# Patient Record
Sex: Male | Born: 1970 | Race: White | Marital: Single | Smoking: Current every day smoker
Health system: Southern US, Community
[De-identification: ages and names within clinical notes are randomized; demographics above are authoritative.]

## PROBLEM LIST (undated history)

## (undated) DIAGNOSIS — E669 Obesity, unspecified: Secondary | ICD-10-CM

## (undated) DIAGNOSIS — Z720 Tobacco use: Secondary | ICD-10-CM

## (undated) DIAGNOSIS — L301 Dyshidrosis [pompholyx]: Secondary | ICD-10-CM

## (undated) DIAGNOSIS — M5430 Sciatica, unspecified side: Secondary | ICD-10-CM

## (undated) DIAGNOSIS — R809 Proteinuria, unspecified: Secondary | ICD-10-CM

## (undated) DIAGNOSIS — R748 Abnormal levels of other serum enzymes: Secondary | ICD-10-CM

## (undated) DIAGNOSIS — F419 Anxiety disorder, unspecified: Secondary | ICD-10-CM

## (undated) DIAGNOSIS — G4700 Insomnia, unspecified: Secondary | ICD-10-CM

## (undated) DIAGNOSIS — E785 Hyperlipidemia, unspecified: Secondary | ICD-10-CM

## (undated) DIAGNOSIS — D72829 Elevated white blood cell count, unspecified: Secondary | ICD-10-CM

## (undated) DIAGNOSIS — I10 Essential (primary) hypertension: Secondary | ICD-10-CM

## (undated) DIAGNOSIS — IMO0001 Reserved for inherently not codable concepts without codable children: Secondary | ICD-10-CM

## (undated) HISTORY — DX: Abnormal levels of other serum enzymes: R74.8

## (undated) HISTORY — DX: Proteinuria, unspecified: R80.9

## (undated) HISTORY — PX: BACK SURGERY: SHX140

## (undated) HISTORY — DX: Insomnia, unspecified: G47.00

## (undated) HISTORY — DX: Obesity, unspecified: E66.9

## (undated) HISTORY — DX: Elevated white blood cell count, unspecified: D72.829

## (undated) HISTORY — DX: Dyshidrosis (pompholyx): L30.1

## (undated) HISTORY — DX: Hyperlipidemia, unspecified: E78.5

## (undated) HISTORY — DX: Anxiety disorder, unspecified: F41.9

## (undated) HISTORY — DX: Essential (primary) hypertension: I10

## (undated) HISTORY — DX: Reserved for inherently not codable concepts without codable children: IMO0001

## (undated) HISTORY — DX: Sciatica, unspecified side: M54.30

## (undated) HISTORY — DX: Tobacco use: Z72.0

---

## 1988-08-21 ENCOUNTER — Emergency Department: Admit: 1988-08-21 | Payer: Self-pay | Source: Ambulatory Visit

## 1990-01-28 ENCOUNTER — Emergency Department: Admit: 1990-01-28 | Disposition: A | Discharge status: Home / Self Care | Payer: Self-pay | Source: Ambulatory Visit

## 1990-08-01 ENCOUNTER — Emergency Department: Admit: 1990-08-01 | Disposition: A | Discharge status: Home / Self Care | Payer: Self-pay | Source: Ambulatory Visit

## 1990-08-02 ENCOUNTER — Inpatient Hospital Stay
Admission: EM | Admit: 1990-08-02 | Disposition: A | Discharge status: Home / Self Care | Payer: Self-pay | Source: Ambulatory Visit

## 1992-01-28 ENCOUNTER — Emergency Department: Admit: 1992-01-28 | Disposition: A | Discharge status: Home / Self Care | Payer: Self-pay | Source: Ambulatory Visit

## 2003-05-27 ENCOUNTER — Emergency Department
Admission: RE | Admit: 2003-05-27 | Disposition: A | Discharge status: Home / Self Care | Payer: Self-pay | Source: Ambulatory Visit

## 2007-08-27 ENCOUNTER — Ambulatory Visit
Admission: RE | Admit: 2007-08-27 | Disposition: A | Discharge status: Home / Self Care | Payer: Self-pay | Source: Ambulatory Visit

## 2011-02-26 ENCOUNTER — Ambulatory Visit
Admission: RE | Admit: 2011-02-26 | Disposition: A | Discharge status: Home / Self Care | Payer: Self-pay | Source: Ambulatory Visit

## 2012-02-13 ENCOUNTER — Ambulatory Visit
Admission: RE | Admit: 2012-02-13 | Disposition: A | Discharge status: Home / Self Care | Payer: Self-pay | Source: Ambulatory Visit

## 2012-03-21 ENCOUNTER — Emergency Department
Admission: EM | Admit: 2012-03-21 | Disposition: A | Discharge status: Home / Self Care | Payer: Self-pay | Source: Ambulatory Visit

## 2012-10-22 ENCOUNTER — Other Ambulatory Visit (INDEPENDENT_AMBULATORY_CARE_PROVIDER_SITE_OTHER): Payer: Self-pay | Admitting: Family Medicine

## 2012-10-29 ENCOUNTER — Ambulatory Visit (INDEPENDENT_AMBULATORY_CARE_PROVIDER_SITE_OTHER)
Admission: RE | Admit: 2012-10-29 | Discharge: 2012-10-29 | Disposition: A | Discharge status: Home / Self Care | Payer: Commercial Managed Care - POS | Source: Ambulatory Visit | Attending: Family Medicine | Admitting: Family Medicine

## 2013-03-16 ENCOUNTER — Ambulatory Visit
Admission: RE | Admit: 2013-03-16 | Discharge: 2013-03-16 | Disposition: A | Discharge status: Home / Self Care | Payer: Commercial Managed Care - POS | Source: Ambulatory Visit | Attending: Family Medicine | Admitting: Family Medicine

## 2013-03-16 ENCOUNTER — Other Ambulatory Visit: Payer: Self-pay | Admitting: Family Medicine

## 2013-03-16 DIAGNOSIS — M25519 Pain in unspecified shoulder: Secondary | ICD-10-CM | POA: Insufficient documentation

## 2014-05-26 ENCOUNTER — Telehealth: Payer: Self-pay

## 2014-05-26 NOTE — Telephone Encounter (Signed)
Call to patient to reschedule appointment from 8:15 this am which he did not show for.Message left with phone number to call to reschedule appointment.

## 2014-12-09 ENCOUNTER — Telehealth: Payer: Self-pay

## 2014-12-09 NOTE — Telephone Encounter (Signed)
Phone call to schedule assessment to start diabetes education. Message left with callback information.

## 2015-01-17 ENCOUNTER — Ambulatory Visit: Payer: No Typology Code available for payment source | Attending: Family Medicine

## 2015-01-17 VITALS — BP 118/78 | Ht 69.0 in | Wt 221.4 lb

## 2015-01-17 DIAGNOSIS — Z713 Dietary counseling and surveillance: Secondary | ICD-10-CM | POA: Insufficient documentation

## 2015-01-17 DIAGNOSIS — Z6832 Body mass index (BMI) 32.0-32.9, adult: Secondary | ICD-10-CM | POA: Insufficient documentation

## 2015-01-17 DIAGNOSIS — Z794 Long term (current) use of insulin: Secondary | ICD-10-CM | POA: Insufficient documentation

## 2015-01-17 DIAGNOSIS — E1165 Type 2 diabetes mellitus with hyperglycemia: Secondary | ICD-10-CM | POA: Insufficient documentation

## 2015-01-17 DIAGNOSIS — IMO0001 Reserved for inherently not codable concepts without codable children: Secondary | ICD-10-CM

## 2015-01-17 DIAGNOSIS — Z7984 Long term (current) use of oral hypoglycemic drugs: Secondary | ICD-10-CM | POA: Insufficient documentation

## 2015-01-17 NOTE — Progress Notes (Signed)
S: Pt attended an assessment today. He has a strong family hx of DM and has been taking meds for DM for 6 years.  His job is stressful and interferes with ease of healthy diet and exercise. SMBG last in Dec of 2015 when he feared BG affect on vision.  Some intentional wt loss. Her was without one medication prior to last A1c test.    O: No BG data today; wt 221.4; 04/2014 A1c was 10.6.    A: Pt could lose 2# a week initially if he stops drinking regular root beer. He is contemplating that as well as a 10 min walk after work.  Pt is willing to take DM classes this week.    P: Limit root beer intake, increase water; walk after work; come to morning DM classes this week as scheduled.

## 2015-01-18 ENCOUNTER — Ambulatory Visit: Payer: No Typology Code available for payment source

## 2015-01-18 ENCOUNTER — Telehealth: Payer: Self-pay

## 2015-01-18 NOTE — Telephone Encounter (Signed)
Pt no show for class.  Left voice mail asking pt to call [phone] to r/s classes.

## 2015-01-19 ENCOUNTER — Ambulatory Visit: Payer: No Typology Code available for payment source

## 2015-01-20 ENCOUNTER — Ambulatory Visit: Payer: No Typology Code available for payment source

## 2015-01-21 ENCOUNTER — Encounter (INDEPENDENT_AMBULATORY_CARE_PROVIDER_SITE_OTHER): Payer: Self-pay

## 2015-02-03 ENCOUNTER — Encounter (INDEPENDENT_AMBULATORY_CARE_PROVIDER_SITE_OTHER): Payer: Self-pay

## 2015-02-03 DIAGNOSIS — G4700 Insomnia, unspecified: Secondary | ICD-10-CM | POA: Insufficient documentation

## 2015-02-03 DIAGNOSIS — F419 Anxiety disorder, unspecified: Secondary | ICD-10-CM | POA: Insufficient documentation

## 2015-02-03 DIAGNOSIS — R748 Abnormal levels of other serum enzymes: Secondary | ICD-10-CM | POA: Insufficient documentation

## 2015-02-03 DIAGNOSIS — M5430 Sciatica, unspecified side: Secondary | ICD-10-CM | POA: Insufficient documentation

## 2015-02-03 DIAGNOSIS — I10 Essential (primary) hypertension: Secondary | ICD-10-CM | POA: Insufficient documentation

## 2015-02-03 DIAGNOSIS — L301 Dyshidrosis [pompholyx]: Secondary | ICD-10-CM | POA: Insufficient documentation

## 2015-02-03 DIAGNOSIS — E785 Hyperlipidemia, unspecified: Secondary | ICD-10-CM | POA: Insufficient documentation

## 2015-02-03 DIAGNOSIS — E669 Obesity, unspecified: Secondary | ICD-10-CM | POA: Insufficient documentation

## 2015-02-03 DIAGNOSIS — R809 Proteinuria, unspecified: Secondary | ICD-10-CM | POA: Insufficient documentation

## 2015-02-03 DIAGNOSIS — Z794 Long term (current) use of insulin: Secondary | ICD-10-CM

## 2015-02-03 DIAGNOSIS — D72829 Elevated white blood cell count, unspecified: Secondary | ICD-10-CM | POA: Insufficient documentation

## 2015-02-03 DIAGNOSIS — F17210 Nicotine dependence, cigarettes, uncomplicated: Secondary | ICD-10-CM | POA: Insufficient documentation

## 2015-02-08 ENCOUNTER — Ambulatory Visit (INDEPENDENT_AMBULATORY_CARE_PROVIDER_SITE_OTHER): Payer: No Typology Code available for payment source

## 2015-02-17 ENCOUNTER — Ambulatory Visit (INDEPENDENT_AMBULATORY_CARE_PROVIDER_SITE_OTHER): Payer: No Typology Code available for payment source | Admitting: Internal Medicine

## 2015-02-17 ENCOUNTER — Encounter (INDEPENDENT_AMBULATORY_CARE_PROVIDER_SITE_OTHER): Payer: Self-pay

## 2015-02-17 VITALS — BP 100/72 | HR 80 | Ht 68.25 in | Wt 218.2 lb

## 2015-02-17 DIAGNOSIS — E782 Mixed hyperlipidemia: Secondary | ICD-10-CM

## 2015-02-17 DIAGNOSIS — E1165 Type 2 diabetes mellitus with hyperglycemia: Secondary | ICD-10-CM

## 2015-02-17 DIAGNOSIS — F17210 Nicotine dependence, cigarettes, uncomplicated: Secondary | ICD-10-CM

## 2015-02-17 DIAGNOSIS — I10 Essential (primary) hypertension: Secondary | ICD-10-CM

## 2015-02-17 DIAGNOSIS — Z6832 Body mass index (BMI) 32.0-32.9, adult: Secondary | ICD-10-CM

## 2015-02-17 MED ORDER — INSULIN DEGLUDEC 100 UNIT/ML SC SOPN
10.0000 [IU] | PEN_INJECTOR | Freq: Every day | SUBCUTANEOUS | Status: DC
Start: 2015-02-17 — End: 2015-07-20

## 2015-02-17 MED ORDER — INSULIN PEN NEEDLE 32G X 4 MM MISC
Status: DC
Start: 2015-02-17 — End: 2015-11-04

## 2015-02-17 MED ORDER — DULAGLUTIDE 0.75 MG/0.5ML SC SOPN
0.7500 mg | PEN_INJECTOR | SUBCUTANEOUS | Status: DC
Start: 2015-02-17 — End: 2015-06-27
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
## 2015-02-17 NOTE — Patient Instructions (Addendum)
Below is a summary of information and instructions we reviewed at today's appointment. Please review this information carefully and call if you have any questions regarding it.     You are due to have your next lab work after: 03/03/15  Please bring your meter to the lab and check your sugar while there.  You should fast for 10 hours prior to having the labwork done.  We have recommended that you check glucose  Once daily at varying times.  Please check that strips are not expired (either by date or opened longer than 3-6 months, depending on meter you have).  Please fax or send blood sugar readings for further adjustment of regimen in 2 weeks.  Insulin regimen: start Tresiba 10 units daily. Take this every am. If this is not covered, we will prescribe another once daily insulin.   Diet: Please decrease your intake of simple sugars and starches. Please watch your portions.  Please aim for a consistent carbohydrate intake at each meal.  Exercise: We have recommended that you begin an exercise regimen.   Foot care: Please avoid being barefoot or wearing of ill fitting shoes. Please examine feet daily and appropriately treat callouses or lesions. Please use lotion as needed to treat dry skin on feet.   Diabetic Education: Please contact diabetes education [phone]) to arrange 1:1 education if unable to attend group class.   Diabetic screening is up-to-date.    Cholesterol/lipids: Your cholesterol levels are elevated. We have reviewed that improvement in diabetic control and decreased intake of "simple" sugars (including fruit, fruit juice, other sweet drinks and sugary foods) can help to improve your triglycerides.    Blood Pressure: Your blood pressure is well controlled on the current medication regimen and we will continue this.

## 2015-03-29 ENCOUNTER — Telehealth (INDEPENDENT_AMBULATORY_CARE_PROVIDER_SITE_OTHER): Payer: Self-pay

## 2015-03-29 NOTE — Telephone Encounter (Signed)
Pt called to have labs faxed to PCP. CNR

## 2015-03-30 ENCOUNTER — Ambulatory Visit (INDEPENDENT_AMBULATORY_CARE_PROVIDER_SITE_OTHER): Payer: No Typology Code available for payment source | Admitting: Family

## 2015-04-10 ENCOUNTER — Telehealth (INDEPENDENT_AMBULATORY_CARE_PROVIDER_SITE_OTHER): Payer: Self-pay | Admitting: Internal Medicine

## 2015-04-10 NOTE — Telephone Encounter (Signed)
Please call pt, I see he pushed out his f/u appt with  JC and is overdue for labs. Please get labs ASAP and he needs to KEEP that f/u appt. We can't help him if he is not coming regularly!

## 2015-04-11 NOTE — Telephone Encounter (Signed)
Spoke with patient and he is having his labs done on Wed Dec 28 at [facility].  He has requested that I fax his labs to them.  Which I have done.  That office is closed today, so he will call them to make sure they have received them

## 2015-04-14 ENCOUNTER — Encounter (INDEPENDENT_AMBULATORY_CARE_PROVIDER_SITE_OTHER): Payer: Self-pay

## 2015-04-14 DIAGNOSIS — E1165 Type 2 diabetes mellitus with hyperglycemia: Secondary | ICD-10-CM

## 2015-04-14 DIAGNOSIS — E782 Mixed hyperlipidemia: Secondary | ICD-10-CM

## 2015-04-14 LAB — COMPREHENSIVE METABOLIC PANEL
ALT: 28 U/L (ref 10–40)
AST (SGOT): 17 U/L (ref 14–40)
Albumin/Globulin Ratio: 1.8
Albumin: 4.5
Alkaline Phosphatase Total: 95
BUN / Creatinine Ratio: 22.4
BUN: 13
Bilirubin, Total: 0.5 mg/dL (ref 0.1–1.4)
CO2: 26 mmol/L — AB (ref 13–22)
Calcium: 9.2 mg/dL (ref 8.7–10.7)
Chloride: 105
Creatinine: 0.58
Globulin: 2.5
Glucose: 156
Potassium: 4.4
Protein, Total: 7
Sodium: 141

## 2015-04-14 LAB — LIPID PANEL
Cholesterol / HDL Ratio: 4.1
Cholesterol: 180
HDL: 44 mg/dL (ref 35–70)
LDL: 111
NON HDL CHOLESTEROL: 136
Triglycerides: 126

## 2015-04-14 LAB — HEMOGLOBIN A1C: A1c: 9.6

## 2015-04-14 LAB — VITAMIN D,25 OH,TOTAL: Vitamin D, 25 OH, Total: 32

## 2015-04-14 NOTE — Progress Notes (Signed)
Labs updated. CNR

## 2015-04-27 ENCOUNTER — Encounter (INDEPENDENT_AMBULATORY_CARE_PROVIDER_SITE_OTHER): Payer: Self-pay | Admitting: Family

## 2015-04-27 ENCOUNTER — Ambulatory Visit (INDEPENDENT_AMBULATORY_CARE_PROVIDER_SITE_OTHER): Payer: No Typology Code available for payment source | Admitting: Family

## 2015-04-27 VITALS — BP 122/70 | HR 72 | Resp 16 | Ht 68.25 in | Wt 217.0 lb

## 2015-04-27 DIAGNOSIS — E782 Mixed hyperlipidemia: Secondary | ICD-10-CM

## 2015-04-27 DIAGNOSIS — I10 Essential (primary) hypertension: Secondary | ICD-10-CM

## 2015-04-27 DIAGNOSIS — Z6832 Body mass index (BMI) 32.0-32.9, adult: Secondary | ICD-10-CM

## 2015-04-27 DIAGNOSIS — F17210 Nicotine dependence, cigarettes, uncomplicated: Secondary | ICD-10-CM

## 2015-04-27 DIAGNOSIS — E1165 Type 2 diabetes mellitus with hyperglycemia: Secondary | ICD-10-CM

## 2015-04-27 NOTE — Patient Instructions (Signed)
Below is a summary of information and instructions we reviewed at today's appointment. Please review this information carefully and call if you have any questions regarding it.     Diabetes:   Labwork: You are due for labwork on or after 07/13/15  You should fast for 10 hours prior to having this labwork done.  Please take your meter to the lab and check your fingerstick blood sugar while there. Do not ask lab staff to check your meter, they do not know how! Please call or send [place] a message when you get home with the reading you got so we can compare to what the lab got and verify your meter is functioning satisfactorily.   Self-monitoring of blood glucose:  We recommend that you monitor your blood glucose at least once daily at varying times  Insulin regimen: continue as prescribed for now. We will plan to start Trulicity on Sunday.  Please obtain and wear a medical alert bracelet or necklace indicating you have diabetes.   Diet: Please watch your portions.  Please try to avoid missing meals.  Exercise: We have recommended that you begin an exercise regimen.   Foot care: Please avoid being barefoot or wearing of ill fitting shoes. Please examine feet daily and appropriately treat callouses or lesions. Please use lotion as needed to treat dry skin on feet.   Diabetic Education: I have recommended that you meet with the nurse educator and dietition to review  Comprehensive Diabetes Education Program Please call and schedule appointment.  The following additional instructions relate to health conditions other than your diabetes:   Please continue follow up with mental health provider.

## 2015-04-27 NOTE — Progress Notes (Addendum)
Provider Progress Note      Patient Name:  Wyatt Campbell [[id number]] DOB: 06-26-1970  Date: 04/27/2015    Subjective:          Wyatt Campbell is a 45 y.o. male who presents for follow up of type 2 diabetes mellitus and instruction regarding Trulicity pen use.   Since the last visit,  the patient has been well with no interval health problems.  Mood has been "fine."    Monitoring, Compliance and Complications:  Diagnosis Date: 2009  HGB A1C goal based on age and comorbidities is 7%.  The patient has no diabetic complications.   Victoza (stopped due to insurance), Byetta (cost)    Glucose Meter: unknown N  Glucose Meter Validation Date: never   The patient did not bring the glucose meter or glucose readings today.  Compliance with blood glucose monitoring: -   The patient is currently testing home blood glucose  - times per day    Glucose readings are as follows:  -    Injections:   Injections are given by patient.   Injections sites include: abdominal wall  Injection compliance: The patient sometimes misses injections  partly due to cost..  No problems noted with injection sites.     Diet, Exercise, and Weight:     The patient follows no particular diet.  Compliance with diet has been "getting better"  Exercise: The patient does not exercise.  Weight: Weight has been stable since last visit.    Hypoglycemia:   There is no history of hypoglycemia.  The patient does not have a Glucagon Emergency Kit.  The patient does not have a medical alert bracelet or necklace.    Diabetic Education: The patient has received diabetes education, most recently October 2016. when patient attended assessment.        Diabetic complication Screening  Eye exam: Aprl 2016 ?[facility] no BDR reported  Urine microalbumin/creatinine ratio: 12/01/14, 2 mg/dl, negative  Foot Care: The patient reports no new foot problems. The patient checks the feet regularly.  The patient cuts toenails without  difficulty.   Monofilament: 02/17/15, intact  Aspirin Therapy: The patient is not currently taking aspirin due to not starting it. It has been recommended in past. no hx GIB/allergy  Cardiovascular risk factors: diabetes mellitus, dyslipidemia, hypertension, male gender, obesity (BMI >= 30 kg/m2) and smoking/ tobacco exposure  Ace Inhibitor/ARB: The patient is taking ACE-I/ARB at dose indicated in medication list and is tolerating this well.  Dental care: Patient has received regular dental care, most recently 02/16/15    Current Outpatient Prescriptions   Medication Sig Dispense Refill   . atorvastatin (LIPITOR) 40 MG tablet Take 40 mg by mouth daily.     . Empagliflozin (JARDIANCE) 25 MG Tab Take by mouth.     Marland Kitchen FA-B6-B12-Omega 3-Phytosterols (BP VIT 3 PO) Take by mouth nightly.     Marland Kitchen FLUoxetine (PROZAC) 10 MG capsule Take 10 mg by mouth daily.     . Insulin Degludec (TRESIBA FLEXTOUCH) 100 UNIT/ML Solution Pen-injector Inject 10 Units into the skin daily. 5 pen 1   . Insulin Pen Needle (BD PEN NEEDLE NANO U/F) 32G X 4 MM Misc Use to inject SC daily 100 each 1   . Lancets (ACCU-CHEK MULTICLIX) lancets Test BID     . lisinopril (PRINIVIL,ZESTRIL) 20 MG tablet Take 20 mg by mouth daily.     . Melatonin 10 MG Cap Take by mouth.     Marland Kitchen  metFORMIN (GLUCOPHAGE) 500 MG tablet TAKE 1 TABLET BY MOUTH EVERY DAY  5   . Multiple Vitamin (MULTIVITAMIN) capsule Take 1 capsule by mouth daily.     . Omega-3 Fatty Acids (OMEGA-3 FISH OIL) 500 MG Cap Take by mouth.     . Dulaglutide (TRULICITY) 0.75 MG/0.5ML Solution Pen-injector Inject 0.75 mg into the skin once a week. 4 pen 0     No current facility-administered medications for this visit.      Medication review with Juanda Chance suggested noncompliance some of the time related to finances and issues with prescription coverage for diabetes medication(s).       Immunization History   Administered Date(s) Administered   . Influenza quadrivalent (IM) PF 3 Yrs & greater  01/29/2015     The following portions of the patient's history were reviewed and updated as appropriate: allergies, current medications, past family history, past medical history, past social history, past surgical history and problem list.     Review of Systems  As above     Objective:      Vital Signs: BP 122/70 mmHg  Pulse 72  Resp 16  Ht 1.734 m (5' 8.25")  Wt 98.431 kg (217 lb)  BMI 32.74 kg/m2  Body mass index is 32.74 kg/(m^2).  PE  Well nourished, well appearing, in no acute distress.  HEENT:  There is no stare or lid lag. There is no periorbital edema. Extraoccular movements are intact. No conjunctival injection.  Mucous membranes are moist. Good dentition.  Neck is supple without adenopathy.  The thyroid is normal in size and consistency and no nodules are appreciated.  Lungs are clear to auscultation.  Cardiac exam reveals a regular rate and rhythm. No murmur or gallop is appreciated.   Exam of the extremities reveals no peripheral edema.      There is no significant deformity of the feet.  Callouses are noted great toe on right, mild. Toenail hygeine is  fair   On neurologic exam, the patient is alert and appropriate. Gait and speech are normal.   Skin is cool and dry.      Lab Review  Labs obtained from outside facility [facility]  were reviewed today, including:   04/14/15 glucose 156, creatinine 0.5, ALT 28, A1c 9.6%, vitamin D 32, LDL 111  12/01/14 A1c 11.5%, Tchol 268, trig 163, HDL 44, LDL 191, glucose 209, creat 0.56, TSH 0.818.     Lab Results   Component Value Date    CHOL 180 04/13/2015    HDL 44 04/13/2015    TRIG 161 04/13/2015    ALT 28 04/14/2015    VITD 32 04/13/2015        Assessment:      1. Uncontrolled type 2 diabetes mellitus with hyperglycemia, without long-term current use of insulin  Hemoglobin A1C    Lipid panel    ALT    Basic Metabolic Panel   2. BMI 32.0-32.9,adult     3. Mixed hyperlipidemia     4. Essential hypertension     5. Cigarette nicotine dependence without  complication         Diabetes: Diabetes not controlled but improving. Patient to start Trulicity. Demonstrated Trulicity pen use. Reviewed plan for review glucose readings and adjust regimen. Lifestyle modifications encouraged. Please see patient instructions regarding diabetic management outlined below.   Lipids: LDL above goal but improving based on most recent lab testing. Medication compliance has been a factor. Will continue to monitor  BP: Blood pressure is well controlled on the current regimen which is well tolerated and will be continued.    I again encouraged the patient to stop using tobacco products.     Plan:      Patient Instructions   Below is a summary of information and instructions we reviewed at today's appointment. Please review this information carefully and call if you have any questions regarding it.     Diabetes:   Labwork: You are due for labwork on or after 07/13/15  You should fast for 10 hours prior to having this labwork done.  Please take your meter to the lab and check your fingerstick blood sugar while there. Do not ask lab staff to check your meter, they do not know how! Please call or send [place] a message when you get home with the reading you got so we can compare to what the lab got and verify your meter is functioning satisfactorily.   Self-monitoring of blood glucose:  We recommend that you monitor your blood glucose at least once daily at varying times  Insulin regimen: continue as prescribed for now. We will plan to start Trulicity on Sunday.  Please obtain and wear a medical alert bracelet or necklace indicating you have diabetes.   Diet: Please watch your portions.  Please try to avoid missing meals.  Exercise: We have recommended that you begin an exercise regimen.   Foot care: Please avoid being barefoot or wearing of ill fitting shoes. Please examine feet daily and appropriately treat callouses or lesions. Please use lotion as needed to treat dry skin on feet.   Diabetic  Education: I have recommended that you meet with the nurse educator and dietition to review  Comprehensive Diabetes Education Program Please call and schedule appointment.  The following additional instructions relate to health conditions other than your diabetes:   Please continue follow up with mental health provider.            Return in about 2 months (around 06/25/2015).    Allene Pyo, NP

## 2015-05-08 ENCOUNTER — Other Ambulatory Visit (INDEPENDENT_AMBULATORY_CARE_PROVIDER_SITE_OTHER): Payer: Self-pay | Admitting: Internal Medicine

## 2015-05-08 DIAGNOSIS — E1165 Type 2 diabetes mellitus with hyperglycemia: Secondary | ICD-10-CM

## 2015-05-15 NOTE — Progress Notes (Signed)
Note reviewed. Agree with assessment and plan as outlined by Ms. Costin, CFNP above.  Jessey Stehlin A. Roschelle Calandra, MD, FACP, FACE

## 2015-05-18 ENCOUNTER — Ambulatory Visit: Payer: No Typology Code available for payment source | Attending: Internal Medicine

## 2015-05-18 DIAGNOSIS — IMO0001 Reserved for inherently not codable concepts without codable children: Secondary | ICD-10-CM

## 2015-05-18 DIAGNOSIS — E1165 Type 2 diabetes mellitus with hyperglycemia: Secondary | ICD-10-CM | POA: Insufficient documentation

## 2015-05-18 NOTE — Progress Notes (Signed)
Patient in for one on one education today with nurse and dietitian. Verbalized concern regarding cost because he has a bill from the appointment in October 2016. Patient assumed that cost for education would be like going to doctors office and he would only have a small copay. Wanted to know what he was going to be responsible for today. Informed patient that he would need to contact his insurance company for this information. States he will do that and then he will call to reschedule appointment.      No bill for today- patient not seen for education.

## 2015-06-20 ENCOUNTER — Other Ambulatory Visit (INDEPENDENT_AMBULATORY_CARE_PROVIDER_SITE_OTHER): Payer: Self-pay

## 2015-06-20 NOTE — Telephone Encounter (Signed)
Please contact patient re: Trulicity: Would like to consider dose increase. Is he tolerating med? Any episodes of hypoglycemia?Please send update on readings so we can determine if dose adjustment appropriate.

## 2015-06-20 NOTE — Telephone Encounter (Signed)
Pt tolerating well. CNR

## 2015-06-21 ENCOUNTER — Other Ambulatory Visit (INDEPENDENT_AMBULATORY_CARE_PROVIDER_SITE_OTHER): Payer: Self-pay

## 2015-06-21 NOTE — Telephone Encounter (Signed)
Pt will send in blood sugar readings tomorrow. CNR

## 2015-06-21 NOTE — Telephone Encounter (Signed)
Sent request for new dose

## 2015-06-21 NOTE — Telephone Encounter (Signed)
Pt tolerating medication no problems and no problems with low bs. He has however been out for 2 weeks. He is having difficulty getting from mail order where it needs to go. CNR

## 2015-06-21 NOTE — Telephone Encounter (Signed)
Can you please ask patient to send readings so we can review and determine if needs increase in dose of medication? Did he have some readings while on Trulicity?

## 2015-06-27 ENCOUNTER — Other Ambulatory Visit (INDEPENDENT_AMBULATORY_CARE_PROVIDER_SITE_OTHER): Payer: Self-pay | Admitting: Family

## 2015-06-27 MED ORDER — DULAGLUTIDE 0.75 MG/0.5ML SC SOPN
0.7500 mg | PEN_INJECTOR | SUBCUTANEOUS | Status: DC
Start: 2015-06-27 — End: 2015-07-20

## 2015-06-27 NOTE — Telephone Encounter (Signed)
Did not see glucose log. WIll send for same dose he had been on. Please ask patient to send readings for review in 2-3 weeks once back on Trulicity.

## 2015-06-29 NOTE — Telephone Encounter (Signed)
Pt aware and understanding. CNR

## 2015-07-18 ENCOUNTER — Telehealth (INDEPENDENT_AMBULATORY_CARE_PROVIDER_SITE_OTHER): Payer: Self-pay

## 2015-07-18 NOTE — Telephone Encounter (Signed)
Pt agrees to complete labs prior to appt.

## 2015-07-19 ENCOUNTER — Other Ambulatory Visit
Admission: RE | Admit: 2015-07-19 | Discharge: 2015-07-19 | Disposition: A | Discharge status: Home / Self Care | Payer: No Typology Code available for payment source | Source: Ambulatory Visit | Attending: Family | Admitting: Family

## 2015-07-19 DIAGNOSIS — E1165 Type 2 diabetes mellitus with hyperglycemia: Secondary | ICD-10-CM

## 2015-07-19 LAB — BASIC METABOLIC PANEL
Anion Gap: 17.8 mMol/L (ref 7.0–18.0)
BUN / Creatinine Ratio: 19.7 Ratio (ref 10.0–30.0)
BUN: 15 mg/dL (ref 7–22)
CO2: 24.9 mMol/L (ref 20.0–30.0)
Calcium: 9.6 mg/dL (ref 8.5–10.5)
Chloride: 100 mMol/L (ref 98–110)
Creatinine: 0.76 mg/dL — ABNORMAL LOW (ref 0.80–1.30)
EGFR: 109 mL/min/{1.73_m2} (ref 60–150)
Glucose: 159 mg/dL — ABNORMAL HIGH (ref 70–99)
Osmolality Calc: 280 mOsm/kg (ref 275–300)
Potassium: 4.7 mMol/L (ref 3.5–5.3)
Sodium: 138 mMol/L (ref 136–147)

## 2015-07-19 LAB — LIPID PANEL
Cholesterol: 200 mg/dL — ABNORMAL HIGH (ref 75–199)
Coronary Heart Disease Risk: 4.76
HDL: 42 mg/dL (ref 40–55)
LDL Calculated: 129 mg/dL
Triglycerides: 144 mg/dL (ref 10–150)
VLDL: 29 (ref 0–40)

## 2015-07-19 LAB — ALT: ALT: 28 U/L (ref 0–55)

## 2015-07-19 LAB — HEMOGLOBIN A1C: Hgb A1C, %: 10.7 %

## 2015-07-19 NOTE — Progress Notes (Signed)
Quick Note:    Labs received and reviewed and will address at upcoming appointment.     ______

## 2015-07-20 ENCOUNTER — Ambulatory Visit (INDEPENDENT_AMBULATORY_CARE_PROVIDER_SITE_OTHER): Payer: No Typology Code available for payment source | Admitting: Family

## 2015-07-20 ENCOUNTER — Other Ambulatory Visit (INDEPENDENT_AMBULATORY_CARE_PROVIDER_SITE_OTHER): Payer: Self-pay

## 2015-07-20 ENCOUNTER — Encounter (INDEPENDENT_AMBULATORY_CARE_PROVIDER_SITE_OTHER): Payer: Self-pay | Admitting: Family

## 2015-07-20 VITALS — BP 132/84 | HR 88 | Resp 16 | Ht 68.25 in | Wt 217.0 lb

## 2015-07-20 DIAGNOSIS — E782 Mixed hyperlipidemia: Secondary | ICD-10-CM

## 2015-07-20 DIAGNOSIS — Z794 Long term (current) use of insulin: Secondary | ICD-10-CM

## 2015-07-20 DIAGNOSIS — Z6832 Body mass index (BMI) 32.0-32.9, adult: Secondary | ICD-10-CM

## 2015-07-20 DIAGNOSIS — E1165 Type 2 diabetes mellitus with hyperglycemia: Secondary | ICD-10-CM

## 2015-07-20 DIAGNOSIS — F17210 Nicotine dependence, cigarettes, uncomplicated: Secondary | ICD-10-CM

## 2015-07-20 DIAGNOSIS — I10 Essential (primary) hypertension: Secondary | ICD-10-CM

## 2015-07-20 MED ORDER — INSULIN GLARGINE 100 UNIT/ML SC SOPN
10.0000 [IU] | PEN_INJECTOR | Freq: Every morning | SUBCUTANEOUS | Status: DC
Start: 2015-07-20 — End: 2015-07-21

## 2015-07-20 MED ORDER — DULAGLUTIDE 0.75 MG/0.5ML SC SOPN
0.7500 mg | PEN_INJECTOR | SUBCUTANEOUS | Status: AC
Start: 2015-07-20 — End: ?

## 2015-07-20 NOTE — Telephone Encounter (Signed)
Patient called to let you know that his insurance will not pay for basaglar, they will cover Lantus or toujeo, he wants one of these in the place of the basaglar, please

## 2015-07-20 NOTE — Patient Instructions (Signed)
Below is a summary of information and instructions we reviewed at today's appointment. Please review this information carefully and call if you have any questions regarding it.     Diabetes:   Labwork: You are due for labwork on or after 10/18/15  You should fast for 10 hours prior to having this labwork done.  Please take your meter to the lab and check your fingerstick blood sugar while there. Do not ask lab staff to check your meter, they do not know how! Please call or send [place] a message when you get home with the reading you got so we can compare to what the lab got and verify your meter is functioning satisfactorily.   Self-monitoring of blood glucose: We recommend that you monitor your blood glucose 2 times daily fasting and one other varying time such as before meal or bedtime Please fax, email, drop off or MyChartMessage your blood sugar readings for further adjustment of regimen  in one week  Diabetic medication regimen: Continue your current diabetic medication regimen unchanged except restart Trulicity, add one tablet metformin at dinner, switch from [place] to [place] same dose every 24 hours. Please let me know if problems tolerating regimen..  Hypoglycemia prevention and treatment:  Please carry fast-acting carbohydrate source (preferably glucose tablets) with you at all times. Please treat low blood sugar with 15 grams carbohydrate (4 glucose tablets), wait 15 minutes, and recheck sugar. Re-treat if necessary.   Diet: Please decrease your intake of simple sugars and starches. Please watch your portions.  Please try to avoid missing meals.  Exercise: We have recommended that you increase your exercise   Foot care:  Please avoid being barefoot or wearing of ill fitting shoes. Please examine feet daily and appropriately treat callouses or lesions. Please use lotion as needed to treat dry skin on feet.   Diabetic Education:  I have recommended that you meet with the nurse educator and dietition to review   Annual update on diabetic management    Diabetic screening is up-to-date except for an eye exam. It is important to have your eyes examined every year.

## 2015-07-20 NOTE — Progress Notes (Signed)
Provider Progress Note      Patient Name:  Wyatt Campbell [[id number]] DOB: 23-Jun-1970  Date: 07/20/2015    Subjective:          Wyatt Campbell Wyatt Campbell is a 45 y.o. male who presents for follow up of type 2 diabetes mellitus.   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  Mood has been slightly depressed. He is taking Prozac.  Patient reports that he has not taken Trulicity since January when he ran out of medication and was told insurance did not cover. Patient was advised to send glucose readings for review at that time. Patient notes that he has not been regularly self monitoring glucose. Evaristo Bury not covered by insurance.    Monitoring, Compliance and Complications:  Diagnosis Date: 2009  HGB A1C goal based on age and comorbidities is 7%.  The patient has no diabetic complications.   Victoza (stopped due to insurance), Byetta (cost), higher dose metformin (upset stomach)    Glucose Meter: unknown   Glucose Meter Validation Date: never   The patient did not bring the glucose meter or glucose readings today.  Compliance with blood glucose monitoring: inadequate   The patient is currently testing home blood glucose  maybe 1 times per week   He notes that time is a barrier.  Glucose readings are as follows:  March 31 glucose was 255    Injections:   Injections are given by patient.   Injections sites include: abdominal wall  Injection compliance: The patient never misses injections. Evaristo Bury)  No problems noted with injection sites.     Diet, Exercise, and Weight:     The patient follows no particular diet.  Compliance with diet has been variable.   Exercise: The patient does not exercise. Can be active at times at work.  Weight: Weight has been stable since last visit.    Hypoglycemia:   There is no history of hypoglycemia.  The patient does not have a Glucagon Emergency Kit.  The patient does not have a medical alert  bracelet or necklace.    Diabetic Education: The patient has received diabetes education, most recently October 2016. when patient attended assessment. He did present to DMP for education in February but did not attend due to billing issues. He is not interested in attending class again at [facility].        Diabetic complication Screening  Eye exam: Aprl 2016 ?[facility] no BDR reported He has received notification that he is due for follow up.  Urine microalbumin/creatinine ratio: 12/01/14, 2 mg/dl, negative  Foot Care: The patient reports no new foot problems. The patient checks the feet regularly.  The patient cuts toenails without difficulty.   Monofilament: 02/17/15, intact  Aspirin Therapy: The patient is not currently taking aspirin due to taking OTC anti-inflammatories at times for shoulder pain. no hx GIB/allergy  Cardiovascular risk factors: diabetes mellitus, dyslipidemia, hypertension, male gender, obesity (BMI >= 30 kg/m2) and smoking/ tobacco exposure   Statin therapy: The patient is taking statin therapy as indicated in the medication list below.  Ace Inhibitor/ARB: The patient is taking ACE-I/ARB at dose indicated in medication list and is tolerating this well.  Dental care: Patient has received regular dental care, most recently 02/16/15    Current Outpatient Prescriptions   Medication Sig Dispense Refill   . atorvastatin (LIPITOR) 40 MG tablet Take 40 mg by mouth daily.     Marland Kitchen  Empagliflozin (JARDIANCE) 25 MG Tab Take by mouth.     Marland Kitchen FA-B6-B12-Omega 3-Phytosterols (BP VIT 3 PO) Take by mouth nightly.     Marland Kitchen FLUoxetine (PROZAC) 20 MG capsule TAKE ONE CAPSULE BY MOUTH EVERY MORNING  8   . Insulin Degludec (TRESIBA FLEXTOUCH) 100 UNIT/ML Solution Pen-injector Inject 10 Units into the skin daily. 5 pen 1   . Insulin Pen Needle (BD PEN NEEDLE NANO U/F) 32G X 4 MM Misc Use to inject SC daily 100 each 1   . Lancets (ACCU-CHEK MULTICLIX) lancets Test BID     . lisinopril (PRINIVIL,ZESTRIL) 20 MG tablet  Take 20 mg by mouth daily.     . Melatonin 10 MG Cap Take by mouth.     . metFORMIN (GLUCOPHAGE) 500 MG tablet TAKE 1 TABLET BY MOUTH EVERY DAY  5   . Multiple Vitamin (MULTIVITAMIN) capsule Take 1 capsule by mouth daily.     . Omega-3 Fatty Acids (OMEGA-3 FISH OIL) 500 MG Cap Take by mouth.       No current facility-administered medications for this visit.      Medication review with Juanda Chance suggested noncompliance some of the time related to finances and issues with prescription coverage for diabetes medication(s).       Immunization History   Administered Date(s) Administered   . Influenza quadrivalent (IM) PF 3 Yrs & greater 01/29/2015   Pneumonia vaccine: recalls receiving this in past through PCP office  The following portions of the patient's history were reviewed and updated as appropriate: allergies, current medications, past family history, past medical history, past social history, past surgical history and problem list.     Review of Systems  As above     Objective:      Vital Signs: BP 132/84 mmHg  Pulse 88  Resp 16  Ht 1.734 m (5' 8.25")  Wt 98.431 kg (217 lb)  BMI 32.74 kg/m2  Body mass index is 32.74 kg/(m^2).  PE  Well nourished, well appearing, in no acute distress.  HEENT:  There is no stare or lid lag. There is no periorbital edema. Extraoccular movements are intact. No conjunctival injection.  Mucous membranes are moist. Good dentition.  Neck is supple without adenopathy.  The thyroid is normal in size and consistency and no nodules are appreciated.  Lungs are clear to auscultation.  Cardiac exam reveals a regular rate and rhythm. No murmur or gallop is appreciated.   Exam of the extremities reveals no peripheral edema.      There is no significant deformity of the feet. No callouses or other lesions are noted. Toenail hygeine is good.   On neurologic exam, the patient is alert and appropriate. Gait and speech are normal.   Skin is cool and dry.      Lab Review  Labs  obtained from outside facility [facility]  were reviewed today, including:   12/01/14 A1c 11.5%, Tchol 268, trig 163, HDL 44, LDL 191, glucose 209, creat 0.56, TSH 0.818.       Lab Results   Component Value Date    HGBA1CPERCNT 10.7 07/19/2015    CHOL 200* 07/19/2015    HDL 42 07/19/2015    LDL 129 07/19/2015    TRIG 144 07/19/2015    ALT 28 07/19/2015    VITD 32 04/13/2015    GLU 159* 07/19/2015    K 4.7 07/19/2015    CA 9.6 07/19/2015    CREAT 0.76* 07/19/2015    CO2 24.9  07/19/2015    EGFR 109 07/19/2015    NA 138 07/19/2015         Assessment:      1. Uncontrolled type 2 diabetes mellitus with hyperglycemia, with long-term current use of insulin     2. Uncontrolled type 2 diabetes mellitus with hyperglycemia, without long-term current use of insulin  Dulaglutide (TRULICITY) 0.75 MG/0.5ML Solution Pen-injector    Hemoglobin A1C    Lipid panel    ALT    Basic Metabolic Panel    DISCONTINUED: insulin glargine (BASAGLAR KWIKPEN) 100 UNIT/ML injection pen   3. BMI 32.0-32.9,adult     4. Mixed hyperlipidemia     5. Essential hypertension     6. Cigarette nicotine dependence without complication         Diabetes: Diabetes is suboptimally controlled. Discussed specific areas of diabetes management as detailed under patient instructions below. Encouraged patient to perform home glucose monitoring as per patient instructions to help improve overall control.  Patient agreeable to attend DM education through [facility]. Discussed recent update re: Trulicity being covered by his insurance starting this month. Will resend Rx and adjust metformin. See plan noted below.  Lipids: LDL above goal but improving based on most recent lab testing. Will continue current regimen and to check lipids with upcoming DM labs.  BP: Blood pressure is well controlled on the current regimen which is well tolerated and will be continued.    I again encouraged the patient to stop using tobacco products.     Plan:      Patient Instructions   Below is a  summary of information and instructions we reviewed at today's appointment. Please review this information carefully and call if you have any questions regarding it.     Diabetes:   Labwork: You are due for labwork on or after 10/18/15  You should fast for 10 hours prior to having this labwork done.  Please take your meter to the lab and check your fingerstick blood sugar while there. Do not ask lab staff to check your meter, they do not know how! Please call or send [place] a message when you get home with the reading you got so we can compare to what the lab got and verify your meter is functioning satisfactorily.   Self-monitoring of blood glucose: We recommend that you monitor your blood glucose 2 times daily fasting and one other varying time such as before meal or bedtime Please fax, email, drop off or MyChartMessage your blood sugar readings for further adjustment of regimen  in one week  Diabetic medication regimen: Continue your current diabetic medication regimen unchanged except restart Trulicity, add one tablet metformin at dinner, switch from [place] to [place] same dose every 24 hours. Please let me know if problems tolerating regimen..  Hypoglycemia prevention and treatment:  Please carry fast-acting carbohydrate source (preferably glucose tablets) with you at all times. Please treat low blood sugar with 15 grams carbohydrate (4 glucose tablets), wait 15 minutes, and recheck sugar. Re-treat if necessary.   Diet: Please decrease your intake of simple sugars and starches. Please watch your portions.  Please try to avoid missing meals.  Exercise: We have recommended that you increase your exercise   Foot care:  Please avoid being barefoot or wearing of ill fitting shoes. Please examine feet daily and appropriately treat callouses or lesions. Please use lotion as needed to treat dry skin on feet.   Diabetic Education:  I have recommended that you meet with the  nurse educator and dietition to review  Annual  update on diabetic management    Diabetic screening is up-to-date except for an eye exam. It is important to have your eyes examined every year.              Return in about 4 weeks (around 08/17/2015).    Allene Pyo, NP

## 2015-07-21 MED ORDER — INSULIN GLARGINE 100 UNIT/ML SC SOPN
PEN_INJECTOR | SUBCUTANEOUS | Status: DC
Start: 2015-07-21 — End: 2015-08-18

## 2015-07-21 MED ORDER — EMPAGLIFLOZIN 25 MG PO TABS
25.0000 mg | ORAL_TABLET | Freq: Every day | ORAL | Status: DC
Start: 2015-07-21 — End: 2015-11-04

## 2015-07-21 MED ORDER — METFORMIN HCL 500 MG PO TABS
ORAL_TABLET | ORAL | Status: DC
Start: 2015-07-21 — End: 2015-11-04

## 2015-07-21 NOTE — Telephone Encounter (Signed)
He wants a vial?

## 2015-07-21 NOTE — Telephone Encounter (Signed)
Never[place]nd

## 2015-07-21 NOTE — Telephone Encounter (Signed)
Please put in request for Lantus same dose

## 2015-07-24 NOTE — Progress Notes (Signed)
Note reviewed. Agree with assessment and plan as outlined by Ms. Costin, CFNP above.  Naiya Corral A. Erica Osuna, MD, FACP, FACE

## 2015-07-27 ENCOUNTER — Other Ambulatory Visit (INDEPENDENT_AMBULATORY_CARE_PROVIDER_SITE_OTHER): Payer: Self-pay

## 2015-07-27 NOTE — Telephone Encounter (Signed)
Patient called asking for test strips and lancets for new device.

## 2015-07-29 MED ORDER — GLUCOSE BLOOD VI STRP
ORAL_STRIP | Status: DC
Start: 2015-07-29 — End: 2015-11-04

## 2015-07-29 MED ORDER — ONETOUCH DELICA LANCETS 33G MISC
Status: DC
Start: 2015-07-29 — End: 2015-11-11

## 2015-08-18 ENCOUNTER — Ambulatory Visit (INDEPENDENT_AMBULATORY_CARE_PROVIDER_SITE_OTHER): Payer: No Typology Code available for payment source | Admitting: Family

## 2015-08-18 ENCOUNTER — Encounter (INDEPENDENT_AMBULATORY_CARE_PROVIDER_SITE_OTHER): Payer: Self-pay | Admitting: Family

## 2015-08-18 VITALS — BP 120/78 | HR 72 | Resp 16 | Ht 68.25 in | Wt 223.0 lb

## 2015-08-18 DIAGNOSIS — Z794 Long term (current) use of insulin: Secondary | ICD-10-CM

## 2015-08-18 DIAGNOSIS — I10 Essential (primary) hypertension: Secondary | ICD-10-CM

## 2015-08-18 DIAGNOSIS — E669 Obesity, unspecified: Secondary | ICD-10-CM

## 2015-08-18 DIAGNOSIS — E782 Mixed hyperlipidemia: Secondary | ICD-10-CM

## 2015-08-18 DIAGNOSIS — F17210 Nicotine dependence, cigarettes, uncomplicated: Secondary | ICD-10-CM

## 2015-08-18 DIAGNOSIS — E1165 Type 2 diabetes mellitus with hyperglycemia: Secondary | ICD-10-CM

## 2015-08-18 MED ORDER — ASPIRIN 81 MG PO TBEC
81.0000 mg | DELAYED_RELEASE_TABLET | Freq: Every day | ORAL | Status: AC
Start: 2015-08-18 — End: ?

## 2015-08-18 NOTE — Progress Notes (Addendum)
Provider Progress Note      Patient Name:  Wyatt Campbell [[id number]] DOB: 1970/12/14  Date: 08/18/2015    Subjective:          Garret Reddish Nayden Czajka is a 45 y.o. male who presents for follow up of type 2 diabetes mellitus.   Since the last visit,  the patient has been well with no interval health problems.  He has cut back on smoking. He has been on Trulicity for past month and tolerating it. He is taking Prozac.    Monitoring, Compliance and Complications:  Diagnosis Date: 2009  HGB A1C goal based on age and comorbidities is 7%.  The patient has no diabetic complications.   Victoza (stopped due to insurance), Byetta (cost), higher dose metformin (upset stomach)    Glucose Meter: One Touch Verio 45year old  Glucose Meter Validation Date: never   The patient brought the glucose meter and the meter was downloaded and readings reviewed today.  Compliance with blood glucose monitoring: inadequate He did test for 5 days after last visit.   The patient is not currently testing home blood glucose.    He notes that time is a barrier at work. Gets home, too hungry to check glucose before eating.  Glucose readings are as follows:  3/31-4/12 140-408    Injections:   Injections are given by patient.   Injections sites include: abdominal wall  Injection compliance: The patient never misses injections.   No problems noted with injection sites.     Diet, Exercise, and Weight:     The patient follows no particular diet.  Compliance with diet has been variable.   Exercise: The patient does not exercise. Can be active at times at work.  Weight: Weight has increased 6 lbs since last visit.    Hypoglycemia:   There is no history of hypoglycemia.  The patient does not have a Glucagon Emergency Kit.  The patient does not have a medical alert bracelet or necklace.    Diabetic Education: The patient has received diabetes education, most recently October 2016. when patient attended assessment. He did present to DMP for  education in February but did not attend due to billing issues. He is not interested in attending class again at [facility]. Has not been contacted by DMP at [facility].        Diabetic complication Screening  Eye exam: Aprl 2016 ?[facility] no BDR reported He has received notification that he is due for follow up.  Urine microalbumin/creatinine ratio: 12/01/14, 2 mg/dl, negative  Foot Care: The patient reports no new foot problems. The patient checks the feet regularly.  The patient cuts toenails without difficulty.  Notes ongoing issues with ingrown toenails  Monofilament: 02/17/15, intact  Aspirin Therapy: The patient is not currently taking aspirin due to taking OTC anti-inflammatories at times for shoulder pain. no hx GIB/allergy  Cardiovascular risk factors: diabetes mellitus, dyslipidemia, hypertension, male gender, obesity (BMI >= 30 kg/m2) and smoking/ tobacco exposure   Statin therapy: The patient is taking statin therapy as indicated in the medication list below.  Ace Inhibitor/ARB: The patient is taking ACE-I/ARB at dose indicated in medication list and is tolerating this well.  Dental care: Patient has received regular dental care, most recently 02/16/15 needs crown    Current Outpatient Prescriptions   Medication Sig Dispense Refill   . atorvastatin (LIPITOR) 40 MG tablet Take 40 mg by mouth daily.     . Dulaglutide (TRULICITY) 0.75 MG/0.5ML Solution  Pen-injector Inject 0.75 mg into the skin once a week. 2 mL 1   . Empagliflozin (JARDIANCE) 25 MG Tab Take 1 tablet (25 mg total) by mouth daily. 90 tablet 1   . FA-B6-B12-Omega 3-Phytosterols (BP VIT 3 PO) Take by mouth nightly.     Marland Kitchen FLUoxetine (PROZAC) 20 MG capsule TAKE ONE CAPSULE BY MOUTH EVERY MORNING  8   . glucose blood test strip Test 3 times daily and PRN 200 each 3   . Insulin Degludec (TRESIBA FLEXTOUCH) 100 UNIT/ML Solution Pen-injector Inject 10 Units into the skin every morning.     . Insulin Pen Needle (BD PEN NEEDLE NANO U/F) 32G X 4 MM  Misc Use to inject SC daily 100 each 1   . lisinopril (PRINIVIL,ZESTRIL) 20 MG tablet Take 20 mg by mouth daily.     . Melatonin 10 MG Cap Take by mouth.     . metFORMIN (GLUCOPHAGE) 500 MG tablet TAKE 1 TABLET BY MOUTH BID with meals 180 tablet 1   . Multiple Vitamin (MULTIVITAMIN) capsule Take 1 capsule by mouth daily.     . Omega-3 Fatty Acids (OMEGA-3 FISH OIL) 500 MG Cap Take by mouth.     Letta Pate DELICA LANCETS 33G Misc Test 3 times a day and PRN 200 each 3     No current facility-administered medications for this visit.      Medication review with Juanda Chance suggested noncompliance some of the time related to finances and issues with prescription coverage for diabetes medication(s).       Immunization History   Administered Date(s) Administered   . Influenza quadrivalent (IM) PF 3 Yrs & greater 01/29/2015   Pneumonia vaccine: recalls receiving this in past through PCP office  The following portions of the patient's history were reviewed and updated as appropriate: allergies, current medications, past family history, past medical history, past social history, past surgical history and problem list.     Review of Systems  Negative for CP, SOB or edema     Objective:      Vital Signs: BP 120/78 mmHg  Pulse 72  Resp 16  Ht 1.734 m (5' 8.25")  Wt 101.152 kg (223 lb)  BMI 33.64 kg/m2  Body mass index is 33.64 kg/(m^2).  PE  Well nourished, well appearing, in no acute distress.  HEENT:  Mucous membranes are moist. Good dentition.     There is no significant deformity of the feet. No callouses or other lesions are noted. Toenail hygeine is good.   On neurologic exam, the patient is alert and appropriate. Gait and speech are normal.      Lab Review  Labs obtained from outside facility [facility]  were reviewed today, including:   12/01/14 A1c 11.5%, Tchol 268, trig 163, HDL 44, LDL 191, glucose 209, creat 0.56, TSH 0.818.   Lab Results   Component Value Date    HGBA1CPERCNT 10.7 07/19/2015    CHOL  200* 07/19/2015    HDL 42 07/19/2015    LDL 129 07/19/2015    TRIG 144 07/19/2015    ALT 28 07/19/2015    VITD 32 04/13/2015    GLU 159* 07/19/2015    K 4.7 07/19/2015    CA 9.6 07/19/2015    CREAT 0.76* 07/19/2015    CO2 24.9 07/19/2015    EGFR 109 07/19/2015    NA 138 07/19/2015        Assessment:      1. Uncontrolled type 2 diabetes  mellitus with hyperglycemia, with long-term current use of insulin  Hemoglobin A1C    Lipid panel    Basic Metabolic Panel    ALT   2. Obesity (BMI 30.0-34.9)     3. Mixed hyperlipidemia  Lipid panel   4. Essential hypertension     5. Cigarette nicotine dependence without complication         Diabetes: Diabetes is suboptimally controlled. Discussed specific areas of diabetes management as detailed under patient instructions below. Encouraged patient to perform home glucose monitoring as per patient instructions to help improve overall control. Patient agrees to check glucose 1-2 times daily, varying times and send readings in next week. Will consider increasing dose of Trulicity. Encouraged follow up with mental health provider. Discussed significance of mood on self care.  Lipids: LDL above goal on high intensity statin. Patient expresses plan to improve diet. Will continue current regimen for now and to check lipids with upcoming DM labs. Discussed risk factors for ASCVD. Advised to start low dose EC aspirin.  BP: Blood pressure is well controlled on the current regimen which is well tolerated and will be continued.    I again encouraged the patient to stop using tobacco products.     Plan:      Patient Instructions   Below is a summary of information and instructions we reviewed at today's appointment. Please review this information carefully and call if you have any questions regarding it.     Diabetes:   Labwork: You are due for labwork on or after 10/18/15  You should fast for 10 hours prior to having this labwork done.  Please take your meter to the lab and check your fingerstick  blood sugar while there. Do not ask lab staff to check your meter, they do not know how! Please call or send [place] a message when you get home with the reading you got so we can compare to what the lab got and verify your meter is functioning satisfactorily.   Self-monitoring of blood glucose: We recommend that you monitor your blood glucose before breakfast and at least one other varying time such as before lunch,dinner or bedtime Please fax, email, drop off or MyChartMessage your blood sugar readings for further adjustment of regimen  in one week  Diabetic medication regimen: Continue your current diabetic medication regimen unchanged.   Diet: Please watch your portions.  Please try to avoid missing meals.  Exercise: We have recommended that you increase your exercise .  Foot care:  Please examine feet daily and appropriately treat callouses or lesions. Please avoid being barefoot or wearing of ill fitting shoes. Please3 use lotion as needed to treat dry skin on feet.   Diabetic Education:  I have recommended that you meet with the nurse educator and dietition to review  Annual update on diabetic management  and Nutrition education Recommend call about scheduling your appointment Broadus John DMP [phone]  Diabetic screening is up-to-date except for an eye exam. It is important to have your eyes examined every year.  The following additional instructions relate to health conditions other than your diabetes:   Recommend continue follow up with mental health provider  Please quit smoking.          Return in about 2 months (around 10/28/2015).    Allene Pyo, NP

## 2015-08-18 NOTE — Addendum Note (Signed)
Addended by: Allene Pyo on: 08/18/2015 11:11 AM     Modules accepted: Orders

## 2015-08-18 NOTE — Patient Instructions (Addendum)
Below is a summary of information and instructions we reviewed at today's appointment. Please review this information carefully and call if you have any questions regarding it.     Diabetes:   Labwork: You are due for labwork on or after 10/18/15  You should fast for 10 hours prior to having this labwork done.  Please take your meter to the lab and check your fingerstick blood sugar while there. Do not ask lab staff to check your meter, they do not know how! Please call or send [place] a message when you get home with the reading you got so we can compare to what the lab got and verify your meter is functioning satisfactorily.   Self-monitoring of blood glucose: We recommend that you monitor your blood glucose before breakfast and at least one other varying time such as before lunch,dinner or bedtime Please fax, email, drop off or MyChartMessage your blood sugar readings for further adjustment of regimen  in one week  Diabetic medication regimen: Continue your current diabetic medication regimen unchanged.   Diet: Please watch your portions.  Please try to avoid missing meals.  Exercise: We have recommended that you increase your exercise .  Foot care:  Please examine feet daily and appropriately treat callouses or lesions. Please avoid being barefoot or wearing of ill fitting shoes. Please3 use lotion as needed to treat dry skin on feet.   Diabetic Education:  I have recommended that you meet with the nurse educator and dietition to review  Annual update on diabetic management  and Nutrition education Recommend call about scheduling your appointment Broadus John DMP [phone]  Diabetic screening is up-to-date except for an eye exam. It is important to have your eyes examined every year.  The following additional instructions relate to health conditions other than your diabetes:   Recommend continue follow up with mental health provider  Please quit smoking.

## 2015-08-19 NOTE — Progress Notes (Signed)
Note reviewed. Agree with assessment and plan as outlined by Ms. Costin, CFNP above.  Tyion Boylen A. Javius Sylla, MD, FACP, FACE

## 2015-11-01 ENCOUNTER — Telehealth (INDEPENDENT_AMBULATORY_CARE_PROVIDER_SITE_OTHER): Payer: Self-pay

## 2015-11-01 NOTE — Telephone Encounter (Signed)
Called and spoke with patient asking him to complete fasting lab work prior to appointment with NP Costin on 7/21. Patient verbalized understanding and has no further comments/concerns. Reminded patient that we have moved and gave new office address.

## 2015-11-02 ENCOUNTER — Other Ambulatory Visit
Admission: RE | Admit: 2015-11-02 | Discharge: 2015-11-02 | Disposition: A | Discharge status: Home / Self Care | Payer: No Typology Code available for payment source | Source: Ambulatory Visit | Attending: Family | Admitting: Family

## 2015-11-02 DIAGNOSIS — Z794 Long term (current) use of insulin: Secondary | ICD-10-CM

## 2015-11-02 DIAGNOSIS — E782 Mixed hyperlipidemia: Secondary | ICD-10-CM

## 2015-11-02 LAB — BASIC METABOLIC PANEL
Anion Gap: 18.1 mMol/L — ABNORMAL HIGH (ref 7.0–18.0)
BUN / Creatinine Ratio: 14.7 Ratio (ref 10.0–30.0)
BUN: 11 mg/dL (ref 7–22)
CO2: 30.6 mMol/L — ABNORMAL HIGH (ref 20.0–30.0)
Calcium: 9.2 mg/dL (ref 8.5–10.5)
Chloride: 97 mMol/L — ABNORMAL LOW (ref 98–110)
Creatinine: 0.75 mg/dL — ABNORMAL LOW (ref 0.80–1.30)
EGFR: 109 mL/min/{1.73_m2} (ref 60–150)
Glucose: 190 mg/dL — ABNORMAL HIGH (ref 70–99)
Osmolality Calc: 288 mOsm/kg (ref 275–300)
Potassium: 3.7 mMol/L (ref 3.5–5.3)
Sodium: 142 mMol/L (ref 136–147)

## 2015-11-02 LAB — LIPID PANEL
Cholesterol: 130 mg/dL (ref 75–199)
Coronary Heart Disease Risk: 3.61
HDL: 36 mg/dL — ABNORMAL LOW (ref 40–55)
LDL Calculated: 78 mg/dL
Triglycerides: 82 mg/dL (ref 10–150)
VLDL: 16 (ref 0–40)

## 2015-11-02 LAB — HEMOGLOBIN A1C: Hgb A1C, %: 8.7 %

## 2015-11-02 LAB — ALT: ALT: 26 U/L (ref 0–55)

## 2015-11-03 NOTE — Progress Notes (Signed)
Quick Note:    Labs received and reviewed and will address at upcoming appointment.     ______

## 2015-11-04 ENCOUNTER — Encounter (INDEPENDENT_AMBULATORY_CARE_PROVIDER_SITE_OTHER): Payer: Self-pay | Admitting: Family

## 2015-11-04 ENCOUNTER — Ambulatory Visit (INDEPENDENT_AMBULATORY_CARE_PROVIDER_SITE_OTHER): Payer: No Typology Code available for payment source | Admitting: Family

## 2015-11-04 VITALS — BP 106/74 | HR 96 | Resp 18 | Wt 215.2 lb

## 2015-11-04 DIAGNOSIS — Z6832 Body mass index (BMI) 32.0-32.9, adult: Secondary | ICD-10-CM

## 2015-11-04 DIAGNOSIS — I10 Essential (primary) hypertension: Secondary | ICD-10-CM

## 2015-11-04 DIAGNOSIS — E1165 Type 2 diabetes mellitus with hyperglycemia: Secondary | ICD-10-CM

## 2015-11-04 DIAGNOSIS — E782 Mixed hyperlipidemia: Secondary | ICD-10-CM

## 2015-11-04 DIAGNOSIS — Z794 Long term (current) use of insulin: Secondary | ICD-10-CM

## 2015-11-04 DIAGNOSIS — F17210 Nicotine dependence, cigarettes, uncomplicated: Secondary | ICD-10-CM

## 2015-11-04 MED ORDER — GLUCOSE BLOOD VI STRP
ORAL_STRIP | Status: AC
Start: 2015-11-04 — End: ?

## 2015-11-04 MED ORDER — EMPAGLIFLOZIN 25 MG PO TABS
25.0000 mg | ORAL_TABLET | Freq: Every day | ORAL | Status: AC
Start: 2015-11-04 — End: ?

## 2015-11-04 MED ORDER — BLOOD GLUCOSE MONITORING SUPPL W/DEVICE KIT
PACK | Status: AC
Start: 2015-11-04 — End: ?

## 2015-11-04 MED ORDER — INSULIN PEN NEEDLE 32G X 4 MM MISC
Status: AC
Start: 2015-11-04 — End: ?

## 2015-11-04 MED ORDER — INSULIN DEGLUDEC 100 UNIT/ML SC SOPN
10.0000 [IU] | PEN_INJECTOR | Freq: Every morning | SUBCUTANEOUS | Status: AC
Start: 2015-11-04 — End: ?

## 2015-11-04 MED ORDER — METFORMIN HCL 500 MG PO TABS
ORAL_TABLET | ORAL | Status: AC
Start: 2015-11-04 — End: ?

## 2015-11-04 NOTE — Progress Notes (Signed)
Provider Progress Note      Patient Name:  Wyatt Campbell [[id number]] DOB: 1970/08/19  Date: 11/04/2015    Subjective:          Wyatt Campbell Wyatt Campbell is a 45 y.o. male who presents for follow up of type 2 diabetes mellitus.   Since the last visit, the patient has been doing well overall except for recent ear infection and bronchitis. He is still taking antibiotics. He has not been smoking as much. He is tolerating diabetes medications. He has been feeling stressed about work.    Monitoring, Compliance and Complications:  Diagnosis Date: 2009  HGB A1C goal based on age and comorbidities is 7%.  The patient has no diabetic complications.   Victoza (stopped due to insurance), Byetta (cost), higher dose metformin (upset stomach)    Glucose Meter: One Touch Verio 45year old, now has new meter  Glucose Meter Validation Date: never   The patient did not bring the glucose meter or glucose readings today.  Compliance with blood glucose monitoring: -    The patient is not currently testing home blood glucose.   Stopped 3 days after last visit. He "gave up on it" due to issues with strips "error" messages on meter often when checking glucose  Glucose readings are as follows:  -    Injections:   Injections are given by patient.   Injections sites include: abdominal wall  Injection compliance: The patient never misses injections.   No problems noted with injection sites.     Diet, Exercise, and Weight:     The patient follows no particular diet.  Compliance with diet has been variable.   Exercise: The patient does not exercise. "no time" active at work  Weight: Weight has decreased 8 lbs since last visit.     Hypoglycemia:   There is no history of hypoglycemia.  The patient does not have a Glucagon Emergency Kit.  The patient does not have a medical alert bracelet or necklace.    Diabetic Education: The patient has received diabetes education, most recently October 2016. when patient attended assessment. He  did present to DMP for education in February but did not attend due to billing issues. He is not interested in attending class again at [facility]. Has not been contacted by DMP at [facility].        Diabetic complication Screening   Eye exam: Aprl 2016 ?[facility]yatt [facility] no BDR reported He has received notification that he is due for follow up. Postponed due to work schedule  Urine microalbumin/creatinine ratio: 12/01/14, 2 mg/dl, negative  Foot Care: The patient reports no new foot problems. The patient checks the feet regularly.  The patient cuts toenails without difficulty.  Notes has issues with ingrown toenails now clearing up  Monofilament: 02/17/15, intact  Aspirin Therapy: The patient is not currently taking aspirin due to forgetting.   Cardiovascular risk factors: diabetes mellitus, dyslipidemia, hypertension, male gender, obesity (BMI >= 30 kg/m2) and smoking/ tobacco exposure   Statin therapy: The patient is taking statin therapy as indicated in the medication list below.  Ace Inhibitor/ARB: The patient is taking ACE-I/ARB at dose indicated in medication list and is tolerating this well. was on lisinopril (cough) now on valsartan  Dental care: Patient has received regular dental care, most recently July 2017 needs crown    Current Outpatient Prescriptions   Medication Sig Dispense Refill   . amoxicillin (AMOXIL) 500 MG capsule Take 2 tablets every 8 hrs till  finished     . atorvastatin (LIPITOR) 40 MG tablet Take 40 mg by mouth daily.     . Dulaglutide (TRULICITY) 0.75 MG/0.5ML Solution Pen-injector Inject 0.75 mg into the skin once a week. 2 mL 1   . Empagliflozin (JARDIANCE) 25 MG Tab Take 1 tablet (25 mg total) by mouth daily. 90 tablet 1   . FA-B6-B12-Omega 3-Phytosterols (BP VIT 3 PO) Take by mouth nightly.     Marland Kitchen FLUoxetine (PROZAC) 40 MG capsule Take 40 mg by mouth daily.     Marland Kitchen glucose blood test strip Test 3 times daily and PRN 200 each 3   . HYDROcodone-homatropine (HYCODAN) 5-1.5 MG/5ML syrup As needed  for cough     . Insulin Degludec (TRESIBA FLEXTOUCH) 100 UNIT/ML Solution Pen-injector Inject 10 Units into the skin every morning.     . Insulin Pen Needle (BD PEN NEEDLE NANO U/F) 32G X 4 MM Misc Use to inject SC daily 100 each 1   . Melatonin 10 MG Cap Take 5 mg by mouth.         . metFORMIN (GLUCOPHAGE) 500 MG tablet TAKE 1 TABLET BY MOUTH BID with meals 180 tablet 1   . Multiple Vitamin (MULTIVITAMIN) capsule Take 1 capsule by mouth daily.     . Omega-3 Fatty Acids (OMEGA-3 FISH OIL) 500 MG Cap Take by mouth.     Letta Pate DELICA LANCETS 33G Misc Test 3 times a day and PRN 200 each 3   . valsartan (DIOVAN) 160 MG tablet Take 160 mg by mouth daily.  3   . aspirin EC 81 MG EC tablet Take 1 tablet (81 mg total) by mouth daily.       No current facility-administered medications for this visit.      Medication review with Juanda Chance suggested compliance except forgot Evaristo Bury today    Immunization History   Administered Date(s) Administered   . Influenza quadrivalent (IM) PF 3 Yrs & greater 01/29/2015   Pneumonia vaccine: recalls receiving this in past through PCP office  The following portions of the patient's history were reviewed and updated as appropriate: allergies, current medications, past family history, past medical history, past social history, past surgical history and problem list.     Review of Systems  Negative for CP or edema  Positive for intermittent cough "getting better, starting to be productive"     Objective:      Vital Signs: BP 106/74 mmHg  Pulse 96  Resp 18  Wt 97.614 kg (215 lb 3.2 oz)  Body mass index is 32.46 kg/(m^2).  PE  Well nourished, well appearing, in no acute distress.  HEENT:  There is no stare or lid lag. There is no periorbital edema. Extraoccular movements are intact. No conjunctival injection.  Mucous membranes are moist. Good dentition.  Neck is supple without adenopathy.  The thyroid is normal in size and consistency and no nodules are  appreciated.  Lungs are clear to auscultation.  Cardiac exam reveals a regular rate and rhythm. No murmur or gallop is appreciated.   Exam of the extremities reveals no peripheral edema.      There is no significant deformity of the feet.  Callouses are noted heels (mild). Toenail hygeine is  good   On neurologic exam, the patient is alert and appropriate. Gait and speech are normal.There is no tremor of the outstretched hands. Reflexes are  1+ and symmetrical with  normal relaxation phases.   Skin is warm  and dry.     Lab Review  Labs obtained from outside facility [facility]  were reviewed today, including:   12/01/14 A1c 11.5%, Tchol 268, trig 163, HDL 44, LDL 191, glucose 209, creat 0.56, TSH 0.818.   Lab Results   Component Value Date    HGBA1CPERCNT 8.7 11/02/2015    HGBA1CPERCNT 10.7 07/19/2015    CHOL 130 11/02/2015    HDL 36* 11/02/2015    LDL 78 11/02/2015    TRIG 82 11/02/2015    ALT 26 11/02/2015    VITD 32 04/13/2015    GLU 190* 11/02/2015    K 3.7 11/02/2015    CA 9.2 11/02/2015    CREAT 0.75* 11/02/2015    CO2 30.6* 11/02/2015    EGFR 109 11/02/2015    NA 142 11/02/2015        Assessment:      1. Uncontrolled type 2 diabetes mellitus with hyperglycemia, with long-term current use of insulin  Hemoglobin A1C    Basic Metabolic Panel    ALT    metFORMIN (GLUCOPHAGE) 500 MG tablet    Insulin Pen Needle (BD PEN NEEDLE NANO U/F) 32G X 4 MM Misc    Insulin Degludec (TRESIBA FLEXTOUCH) 100 UNIT/ML Solution Pen-injector    Empagliflozin (JARDIANCE) 25 MG Tab    glucose blood test strip    Blood Glucose Monitoring Suppl w/Device kit   2. BMI 32.0-32.9,adult     3. Mixed hyperlipidemia     4. Essential hypertension     5. Cigarette nicotine dependence without complication       Diabetes: Diabetes is improved but still not adequately controlled on the current regimen. Rationale for improved control discussed with the patient today. Discussed specific areas of diabetes management as detailed under patient instructions  below.  Will increase dose of Trulicity. Discussed risk for and treatment of hypoglycemia. Discussed reportable symptoms. Encouraged to monitor glucose (new meter prescribed) and meet with diabetes educator.   Encouraged follow up with mental health provider. Discussed significance of mood on self care.  Lipids: Lipids are fairly well controlled except low HDL on the current regimen which is well tolerated and will be continued. Encouraged adherence to medication and lifestyle modification. We will continue to monitor lipids and ALT periodically.   BP: Blood pressure is well controlled on the current regimen which is well tolerated and will be continued.    I again encouraged the patient to stop using tobacco products.     Plan:      Below is a summary of information and instructions we reviewed at today's appointment. Please review this information carefully and call if you have any questions regarding it.     Diabetes:   Labwork: You are due for labwork on or after 02/02/16  You do not need to fast prior to having this labwork done.  Please take your meter to the lab and check your fingerstick blood sugar while there. Do not ask lab staff to check your meter, they do not know how! Please call or send [place] a message when you get home with the reading you got so we can compare to what the lab got and verify your meter is functioning satisfactorily.   Self-monitoring of blood glucose: We recommend that you monitor your blood glucose at least twice a day :before breakfast and one other varying time such as before lunch, dinner ,bedtime Please fax, email, drop off or MyChartMessage your blood sugar readings for further adjustment of regimen  1-2 weeks or sooner if problems with low or persistently elevated readings  Diabetic medication regimen: Continue your current diabetic medication regimen unchanged except increase Trulicity to 1.5 mg weekly.  Hypoglycemia prevention and treatment:  Please carry fast-acting  carbohydrate source (preferably glucose tablets) with you at all times. Please treat low blood sugar with 15 grams carbohydrate (4 glucose tablets), wait 15 minutes, and recheck sugar. Re-treat if necessary.   Diet: Please watch your portions.  Please try to avoid missing meals.  Exercise: We have recommended that you increase your exercise .  Foot care:  Please examine feet daily and appropriately treat callouses or lesions. Please avoid being barefoot or wearing of ill fitting shoes. Please use lotion as needed to treat dry skin on feet.   Diabetic Education:  I have recommended that you meet with the nurse educator and dietition to review  Annual update on diabetic management    Diabetic screening is up-to-date except for an eye exam. It is important to have your eyes examined every year.  The following additional instructions relate to health conditions other than your diabetes:   .    Tobacco use:  We have reviewed the health risks associated with tobacco today. Please stop using tobacco products!  For more help and resources, try this website: [employer]       Return in about 8 weeks (around 12/30/2015).    Allene Pyo, NP

## 2015-11-04 NOTE — Patient Instructions (Signed)
Below is a summary of information and instructions we reviewed at today's appointment. Please review this information carefully and call if you have any questions regarding it.     Diabetes:   Labwork: You are due for labwork on or after 02/02/16  You do not need to fast prior to having this labwork done.  Please take your meter to the lab and check your fingerstick blood sugar while there. Do not ask lab staff to check your meter, they do not know how! Please call or send [place] a message when you get home with the reading you got so we can compare to what the lab got and verify your meter is functioning satisfactorily.   Self-monitoring of blood glucose: We recommend that you monitor your blood glucose at least twice a day :before breakfast and one other varying time such as before lunch, dinner ,bedtime Please fax, email, drop off or MyChartMessage your blood sugar readings for further adjustment of regimen  1-2 weeks or sooner if problems with low or persistently elevated readings  Diabetic medication regimen: Continue your current diabetic medication regimen unchanged except increase Trulicity to 1.5 mg weekly.  Hypoglycemia prevention and treatment:  Please carry fast-acting carbohydrate source (preferably glucose tablets) with you at all times. Please treat low blood sugar with 15 grams carbohydrate (4 glucose tablets), wait 15 minutes, and recheck sugar. Re-treat if necessary.   Diet: Please watch your portions.  Please try to avoid missing meals.  Exercise: We have recommended that you increase your exercise .  Foot care:  Please examine feet daily and appropriately treat callouses or lesions. Please avoid being barefoot or wearing of ill fitting shoes. Please use lotion as needed to treat dry skin on feet.   Diabetic Education:  I have recommended that you meet with the nurse educator and dietition to review  Annual update on diabetic management    Diabetic screening is up-to-date except for an eye exam. It  is important to have your eyes examined every year.  The following additional instructions relate to health conditions other than your diabetes:   .    Tobacco use:  We have reviewed the health risks associated with tobacco today. Please stop using tobacco products!  For more help and resources, try this website: [employer]

## 2015-11-05 NOTE — Progress Notes (Signed)
Note reviewed. Agree with assessment and plan as outlined by Ms. Costin, CFNP above.  Erroll Wilbourne A. Jaedan Huttner, MD, FACP, FACE

## 2015-11-11 ENCOUNTER — Other Ambulatory Visit (INDEPENDENT_AMBULATORY_CARE_PROVIDER_SITE_OTHER): Payer: Self-pay

## 2015-11-11 MED ORDER — ONETOUCH DELICA LANCETS 33G MISC
Status: AC
Start: 2015-11-11 — End: ?

## 2016-01-30 ENCOUNTER — Telehealth (INDEPENDENT_AMBULATORY_CARE_PROVIDER_SITE_OTHER): Payer: Self-pay

## 2016-01-30 NOTE — Telephone Encounter (Signed)
Called and LM on HM/Cell VM to please complete labs prior to appointment on 10/23 with NP Costin. Also gave new address and to call office if any questions/concerns

## 2016-02-06 ENCOUNTER — Encounter (INDEPENDENT_AMBULATORY_CARE_PROVIDER_SITE_OTHER): Payer: No Typology Code available for payment source | Admitting: Family

## 2016-02-06 NOTE — Progress Notes (Signed)
A user error has taken place: encounter opened in error, closed for administrative reasons. Medication list was not reconciled and problem list was not reviewed or updated today.        This encounter was created in error - please disregard.    Items noted as "reviewed" are for administrative purposes only and are not guaranteed by the provider to be accurate on this date.

## 2016-04-24 ENCOUNTER — Telehealth (INDEPENDENT_AMBULATORY_CARE_PROVIDER_SITE_OTHER): Payer: Self-pay | Admitting: Internal Medicine

## 2016-04-24 NOTE — Telephone Encounter (Signed)
missed f/u, nsa, can you r/s   Received: Today   Message Contents   Astrid Drafts            Left message at Dr. Loni Muse office on nurse voicemail.    Previous Messages      ----- Message -----   From: Vela Prose, MD   Sent: 04/24/2016 10:06 AM   To: Jethro Poling   Subject: RE: missed f/u, nsa, can you r/s           Please let PCP know that we are having trouble getting him back for follow up, just FYI for them.   ----- Message -----   From: Jethro Poling   Sent: 04/24/2016  9:41 AM   To: Vela Prose, MD   Subject: RE: missed f/u, nsa, can you r/s           Spoke to patient declined to reschedule states working 6 days a week can not come. Will call next month when he gets new work schedule.   ----- Message -----   From: Jethro Poling   Sent: 04/17/2016  9:42 AM   To: Jethro Poling   Subject: RE: missed f/u, nsa, can you r/s           lmtc 04-17-16   ----- Message -----   From: Vela Prose, MD   Sent: 04/13/2016  5:28 PM   To: Jethro Poling   Subject: missed f/u, nsa, can you r/s

## 2016-08-19 ENCOUNTER — Other Ambulatory Visit (INDEPENDENT_AMBULATORY_CARE_PROVIDER_SITE_OTHER): Payer: Self-pay | Admitting: Family

## 2016-08-19 DIAGNOSIS — E1165 Type 2 diabetes mellitus with hyperglycemia: Secondary | ICD-10-CM

## 2016-08-19 DIAGNOSIS — Z794 Long term (current) use of insulin: Secondary | ICD-10-CM

## 2016-08-19 NOTE — Telephone Encounter (Signed)
Patient overdue for labs and follow up. Not seen since last year. Will decline med for now.  Please ask patient to schedule f/u and get labs done if wants to have condition managed here. If sets up appt please pend 30 day supply.

## 2016-08-20 NOTE — Telephone Encounter (Signed)
Called and spoke with patient. 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 Diet and exercise and/or medication. He started the Keto diet and exercise and noted that with this his blood sugars had dropped and he was doing better. He stopped the medication completely and has not been taking any since October. He is on vacation at this time and will contact our office when he gets back to schedule a follow-up

## 2020-08-04 IMAGING — CR XR SHOULDER 2+ VIEWS BILATERAL
2 series · 6 of 6 positions shown · non-contrast
Comparison: none

FINAL REPORT:
BILATERAL SHOULDER RADIOGRAPH SERIES
INDICATION: Pain.

[Series 9138: AP · left · 3 of 3 slices shown (1 of 2)]
[im 1/3]
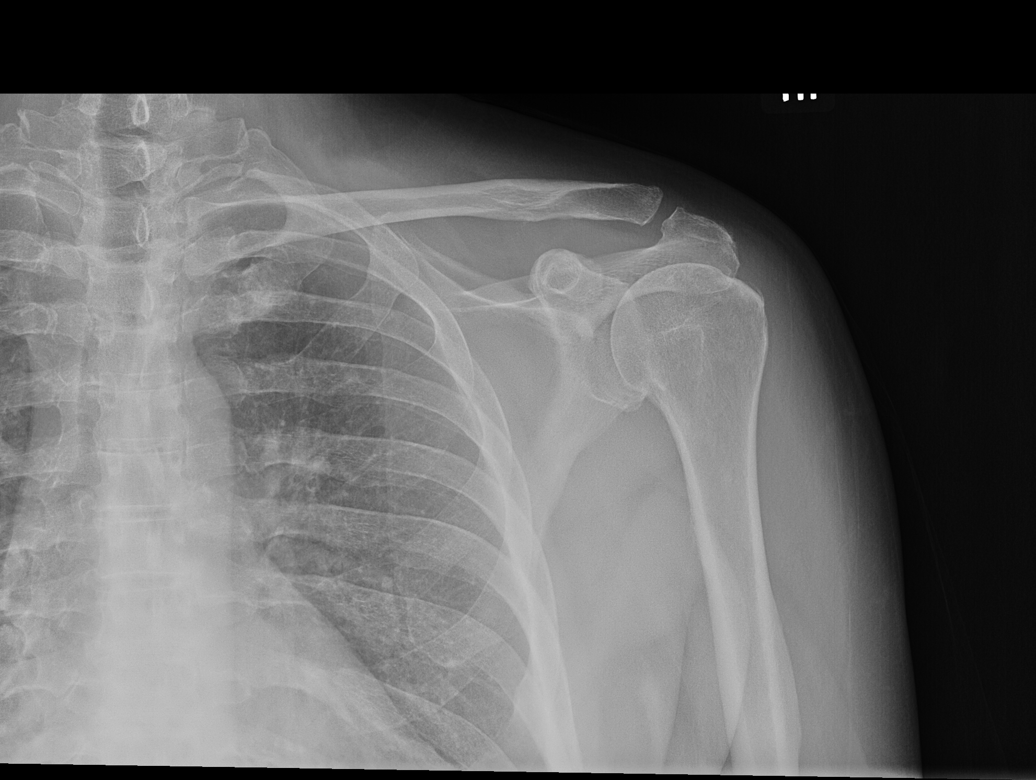
[im 2/3]
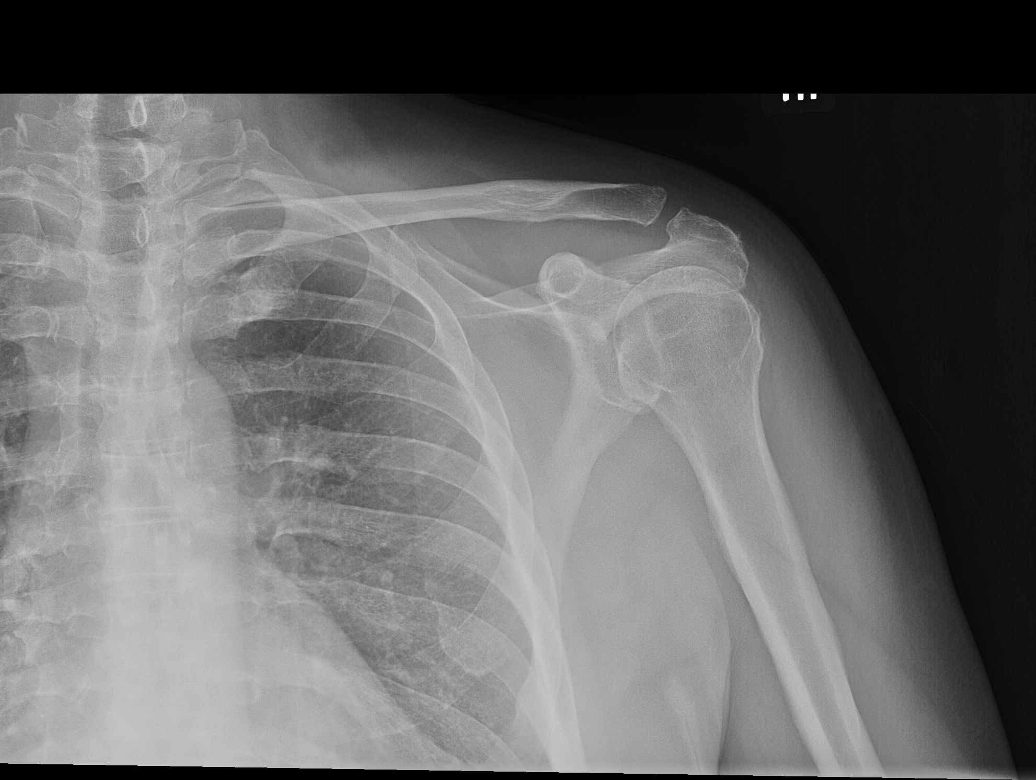
[im 3/3]
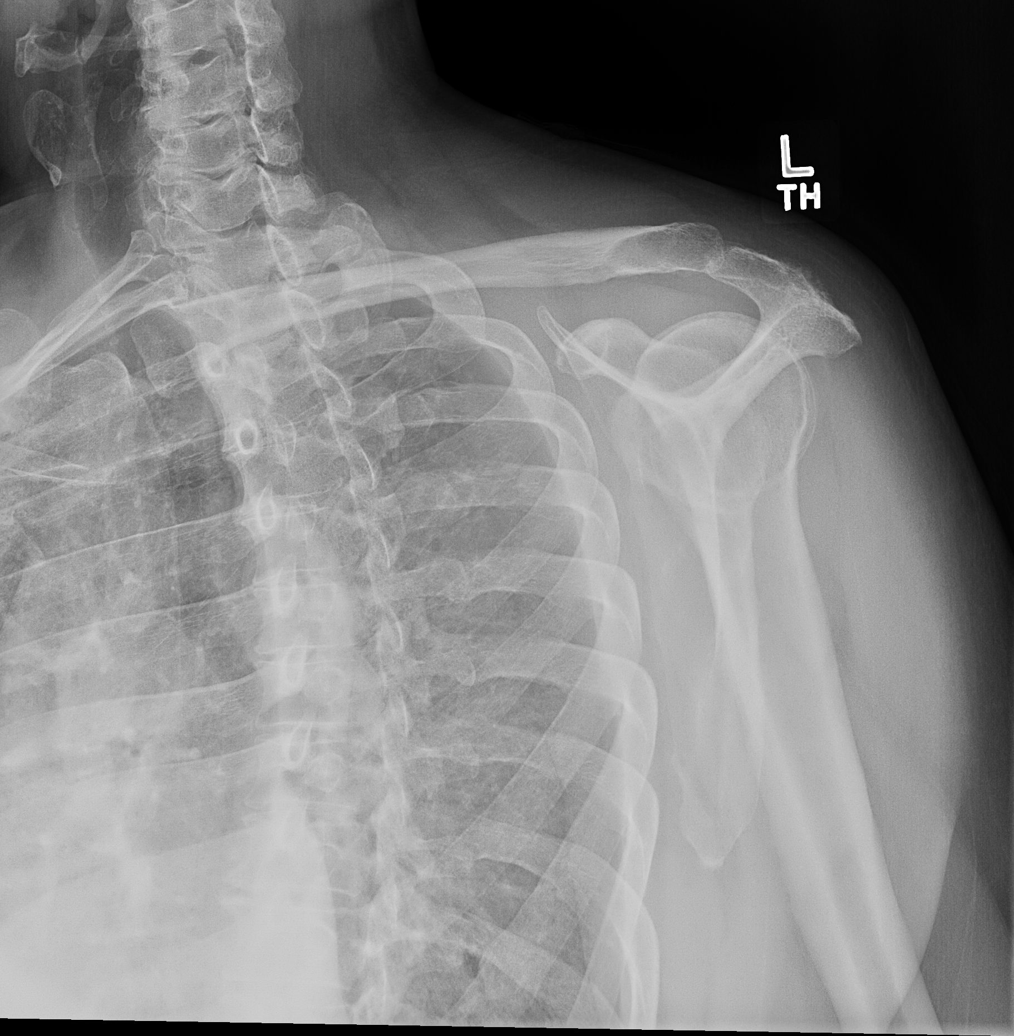

[Series 9138: AP · right · 3 of 3 slices shown (2 of 2)]
[im 1/3]
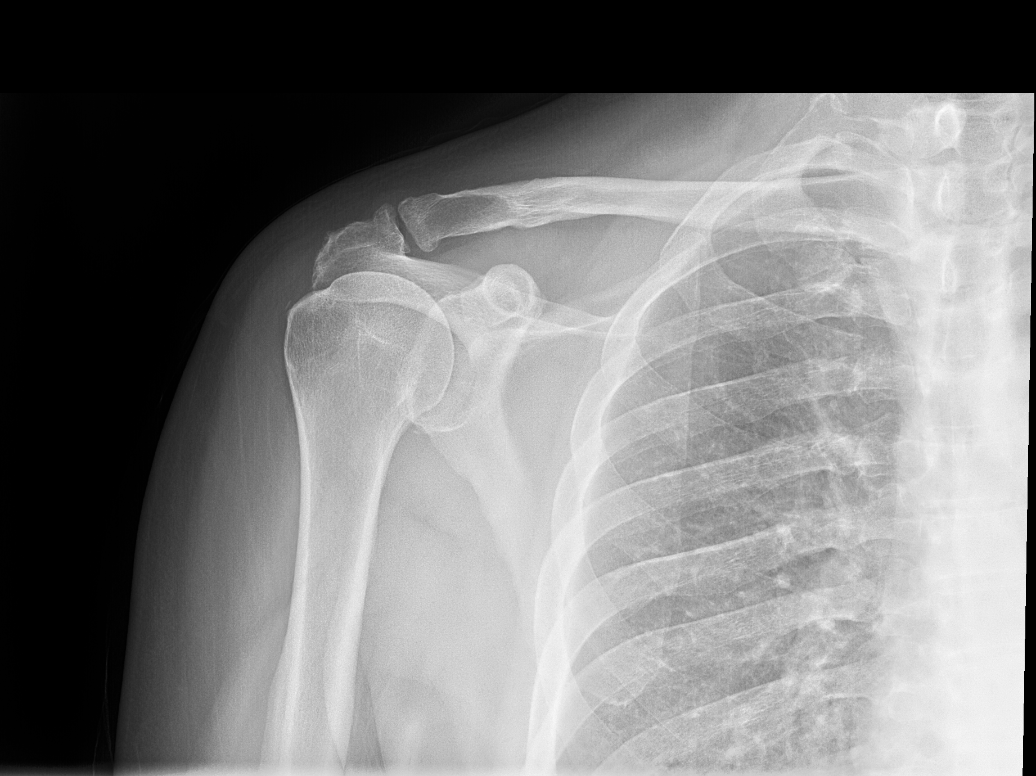
[im 2/3]
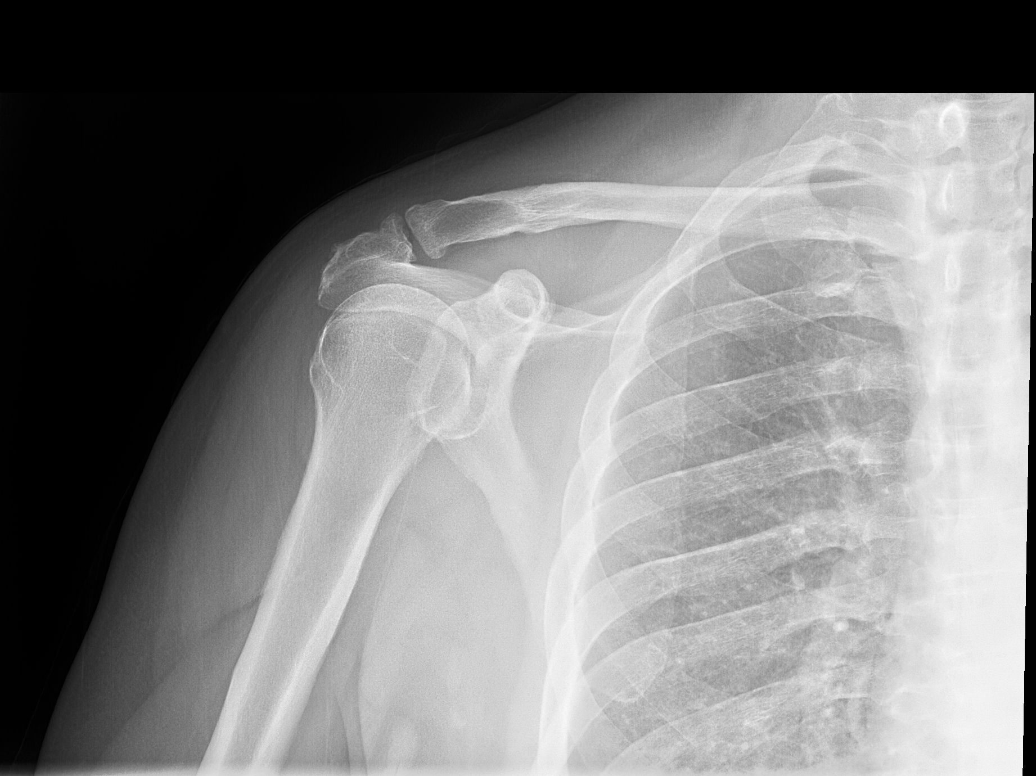
[im 3/3]
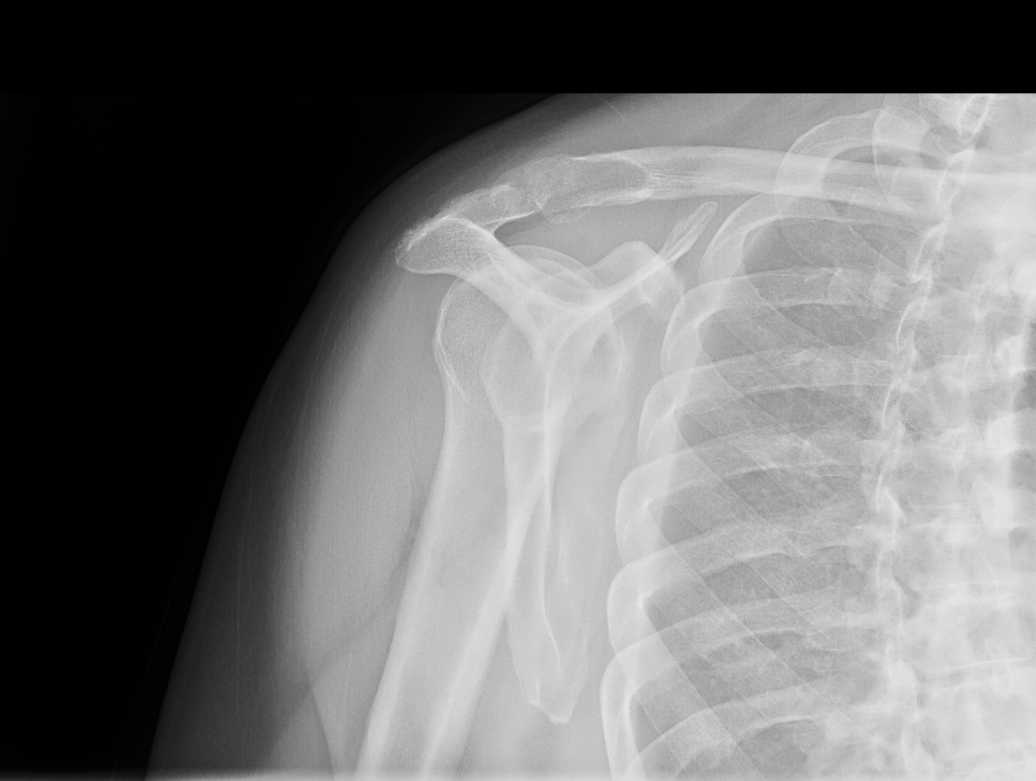

[6 of 6 positions shown; findings below may reference images not displayed]

FINDINGS: AP internal rotation, AP external rotation and scapular Y views of the right and left shoulders show osteopenia without evidence for acute fracture or dislocation. Mild degenerative changes glenohumeral joint. Question raised of mild changes chronic rotator cuff disease. The soft tissues are radiographically unremarkable.
IMPRESSION: Osteopenia without acute osseous abnormality.
Mild glenohumeral DJD.
Suspect minimal changes of chronic rotator cuff disease. If further imaging evaluation is desired, consideration be made for further characterization with left shoulder MRI, preferably with intra-articular contrast.
Location code: 1

## 2020-08-31 IMAGING — MR MRI SHOULDER LEFT WITHOUT IV CONTRAST
6 series · 40 of 40 positions shown · non-contrast
Comparison: none

FINAL REPORT:
MRI SHOULDER LEFT WITHOUT IV CONTRAST
INDICATION: Impingement syndrome of left shoulder
TECHNIQUE: MR left shoulder without contrast. Multiplanar, multisequence imaging was performed and reviewed.

[Series 1: survey · coronal · left · 1.6mm · 1.56mm/px · 19 of 120 slices shown]
[im 1/120]
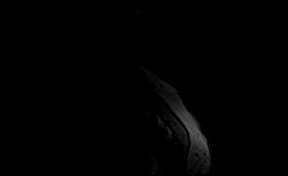
[im 7/120]
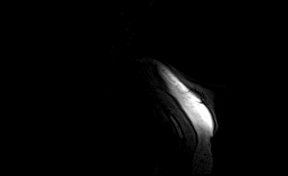
[im 14/120]
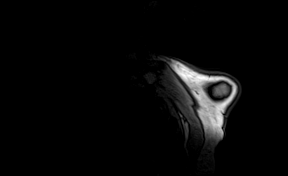
[im 20/120]
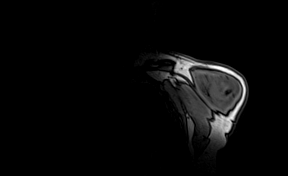
[im 27/120]
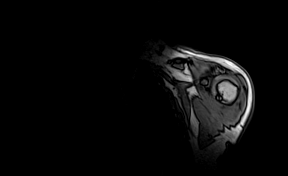
[im 34/120]
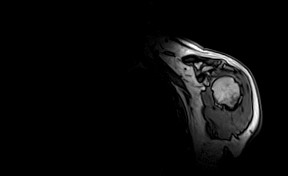
[im 40/120]
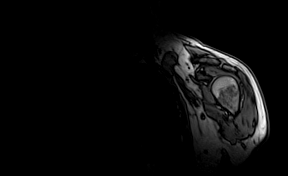
[im 47/120]
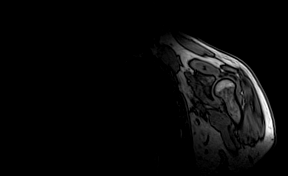
[im 53/120]
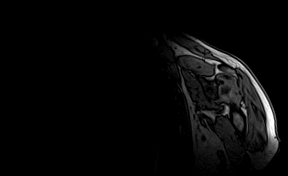
[im 60/120]
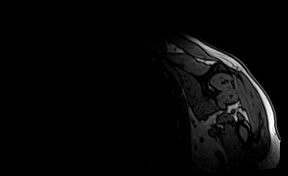
[im 67/120]
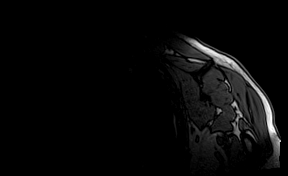
[im 73/120]
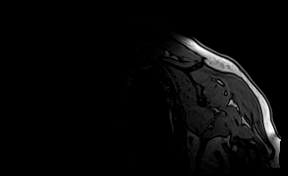
[im 80/120]
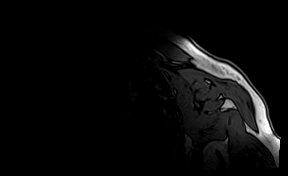
[im 86/120]
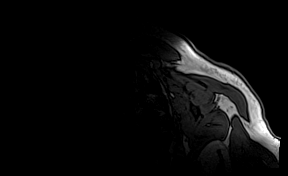
[im 93/120]
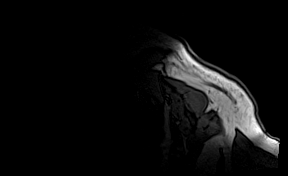
[im 100/120]
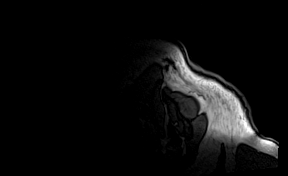
[im 106/120]
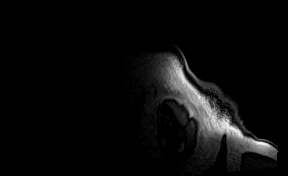
[im 113/120]
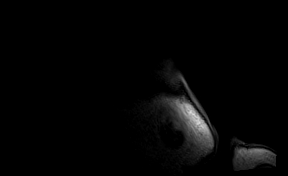
[im 120/120]
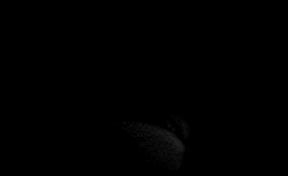

[Series 3: PD fat-sat · axial · left · 3.0mm · 0.47mm/px · z∈[-80,+14]mm · 5 of 32 slices shown]
[im 1/32]
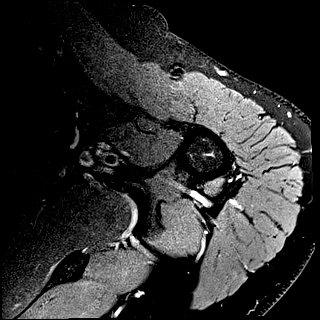
[im 8/32]
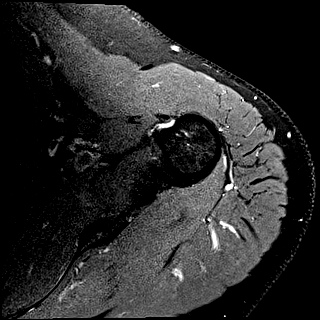
[im 16/32]
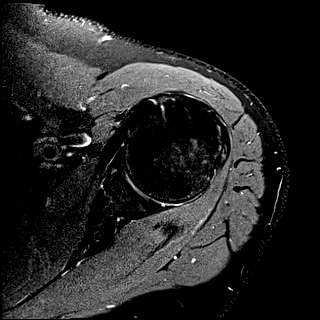
[im 24/32]
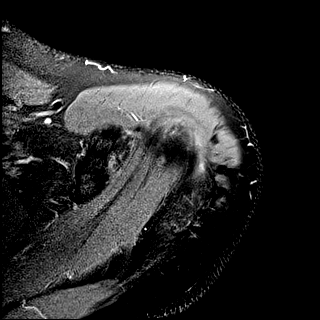
[im 32/32]
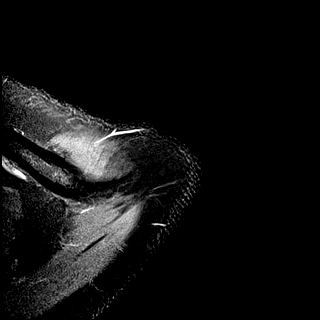

[Series 4: PD · oblique · left · 3.0mm · 0.47mm/px · 4 of 30 slices shown]
[im 1/30]
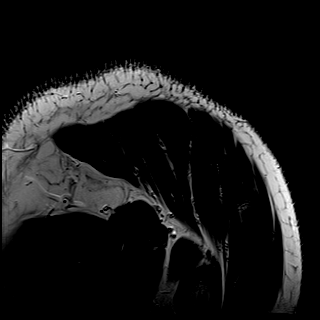
[im 10/30]
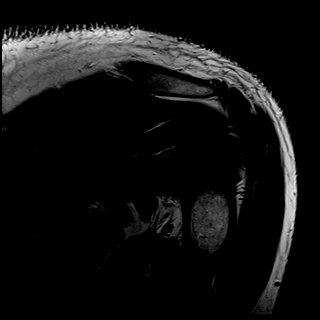
[im 20/30]
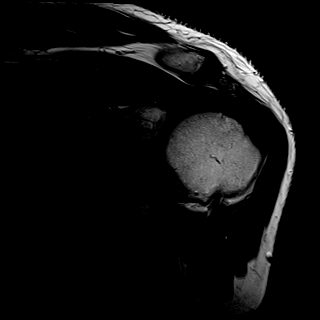
[im 30/30]
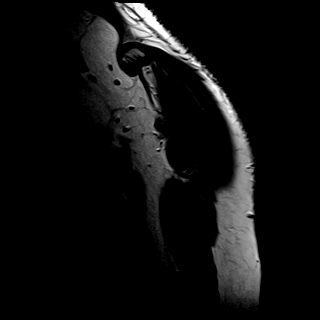

[Series 5: T2 fat-sat · oblique · left · 3.0mm · 0.47mm/px · 4 of 30 slices shown (1 of 2)]
[im 1/30]
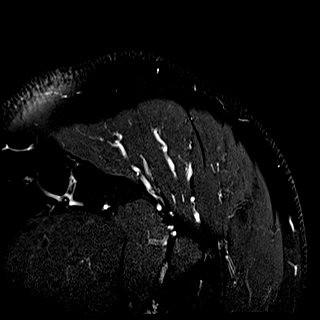
[im 10/30]
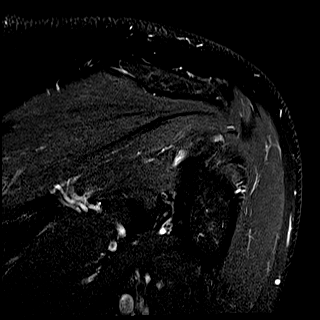
[im 20/30]
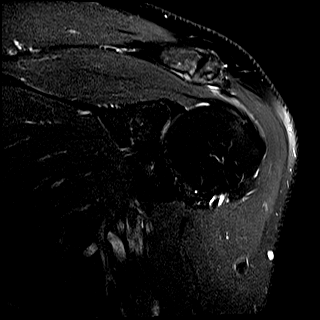
[im 30/30]
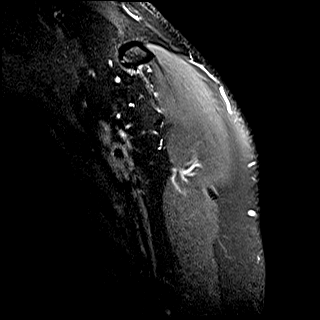

[Series 6: T2 fat-sat · oblique · left · 3.0mm · 0.47mm/px · 4 of 30 slices shown (2 of 2)]
[im 1/30]
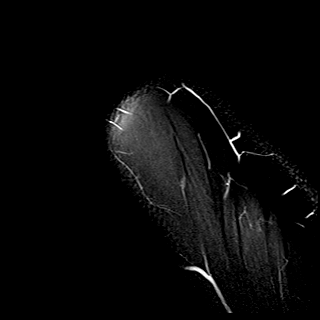
[im 10/30]
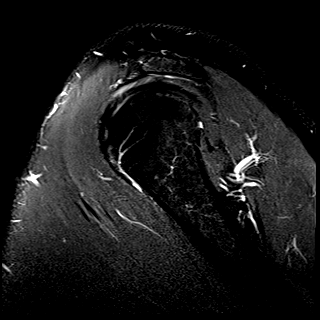
[im 20/30]
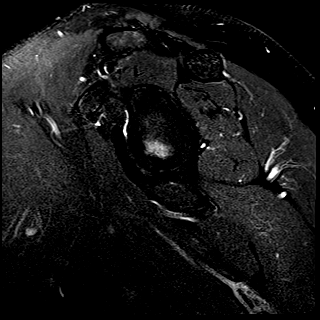
[im 30/30]
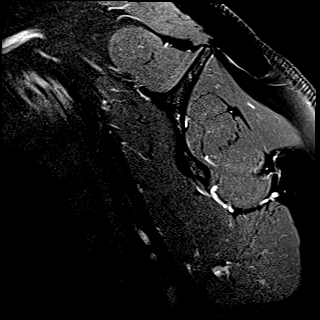

[Series 7: T1 · oblique · left · 3.0mm · 0.39mm/px · 4 of 30 slices shown]
[im 1/30]
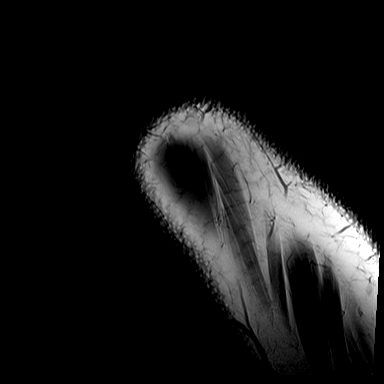
[im 10/30]
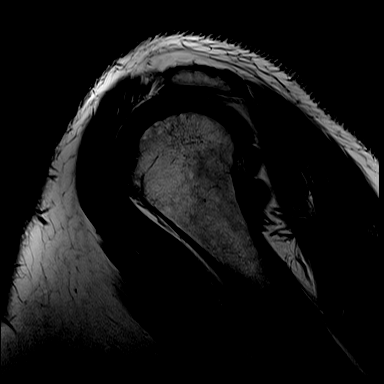
[im 20/30]
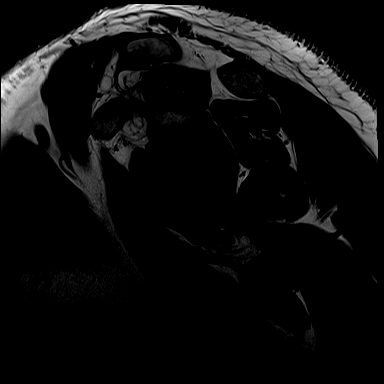
[im 30/30]
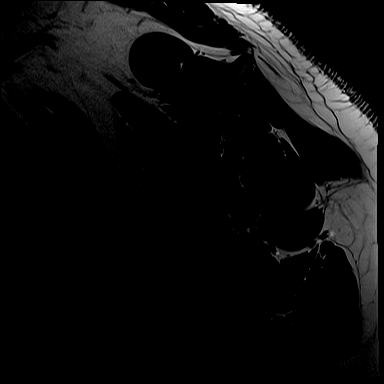

[40 of 40 positions shown; findings below may reference images not displayed]

FINDINGS: There is an area of fluid intensity signal noted extending from the articular surface of the infraspinatus footplate, measuring approximately 10 mm anterior to posterior and estimated to involve up to 80% of the tendon thickness. No evidence of additional rotator cuff tear. No rotator cuff muscle atrophy or edema. The biceps tendon long head is intact and the biceps-labral complex is maintained. No abnormal labral signal changes. No articular chondral defects. No joint effusion or evidence of joint spaces bodies. Minimal, clavicular joint degenerative changes.
IMPRESSION: 
IMPRESSION: 1. MRI findings concerning for small intermediate grade articular surface tear of the infraspinatus tendon as described.
Location 4

## 2021-05-24 IMAGING — MR MRI LUMBAR SPINE WITHOUT CONTRAST
6 of 9 series · 25 of 48 positions shown · non-contrast
Comparison: None.

NECK PAIN SINCE 04-18-21 AND BACK PAIN ON AND OFF 10 YEARS
FINAL REPORT:
EXAM: Lumbar Spine MRI Without Contrast.
HISTORY: Low back pain, unspecified .
TECHNIQUE: Multisequence multiplanar images of the lumbar spine were obtained.

[Series 14: survey · sagittal · 8.0mm · 0.88mm/px · 2 of 8 slices shown]
[im 1/8]
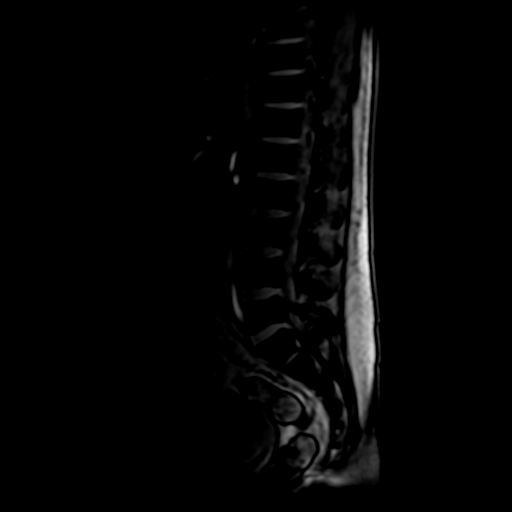
[im 8/8]
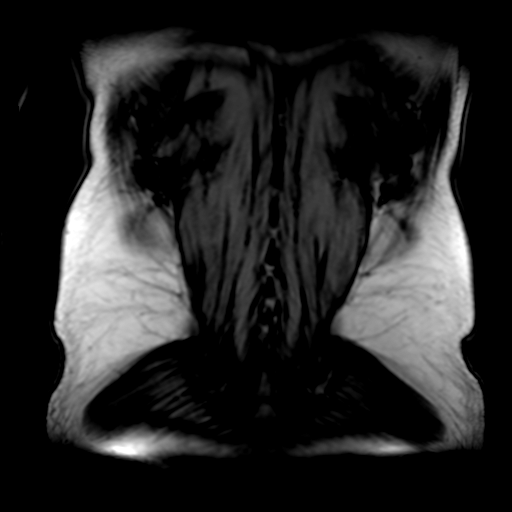

[Series 15: T2 · sagittal · 4.0mm · 0.70mm/px · 3 of 17 slices shown (1 of 2)]
[im 1/17]
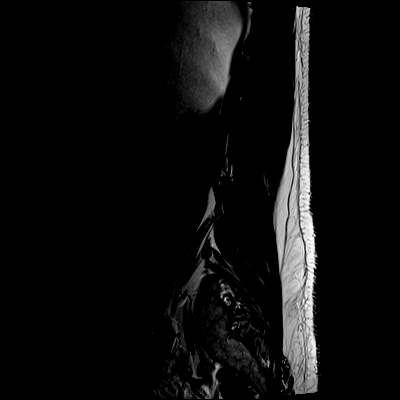
[im 9/17]
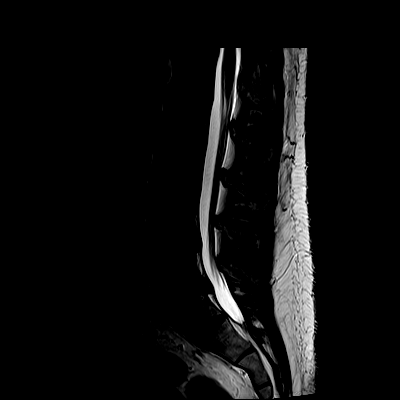
[im 17/17]
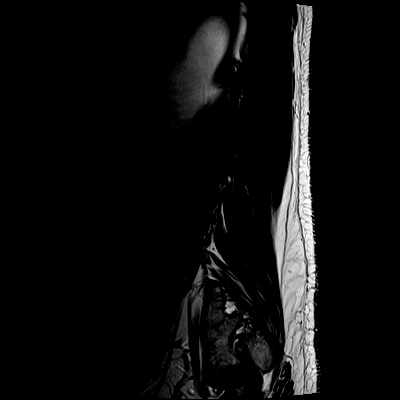

[Series 16: STIR · sagittal · 4.0mm · 0.73mm/px · 3 of 17 slices shown]
[im 1/17]
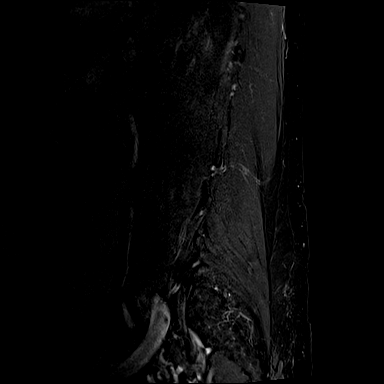
[im 9/17]
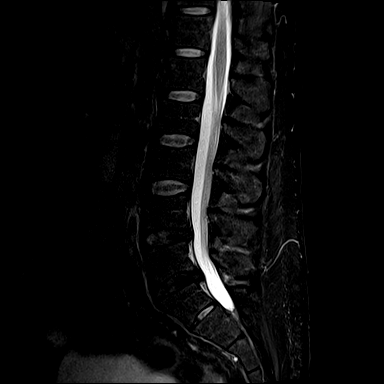
[im 17/17]
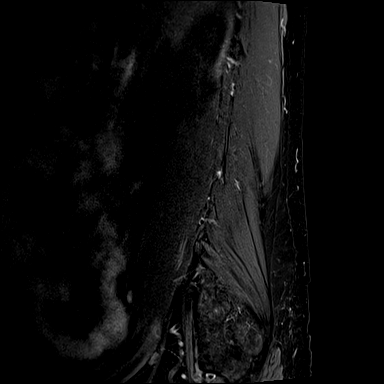

[Series 17: T1 · sagittal · 4.0mm · 0.70mm/px · 3 of 17 slices shown (1 of 2)]
[im 1/17]
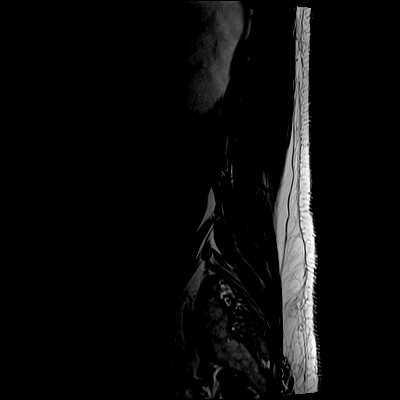
[im 9/17]
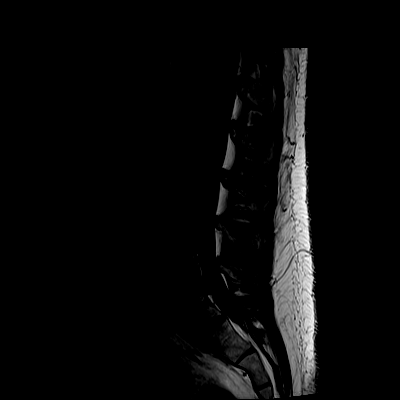
[im 17/17]
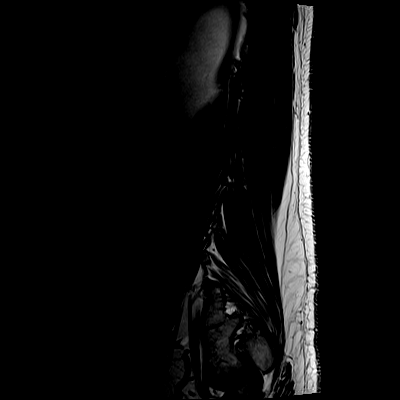

[Series 18: T2 · axial · 4.0mm · 0.57mm/px · z∈[-91,+94]mm · 7 of 33 slices shown (2 of 2)]
[im 1/33]
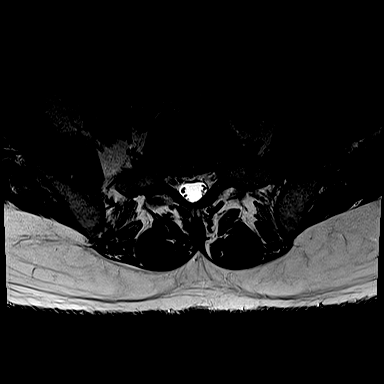
[im 6/33]
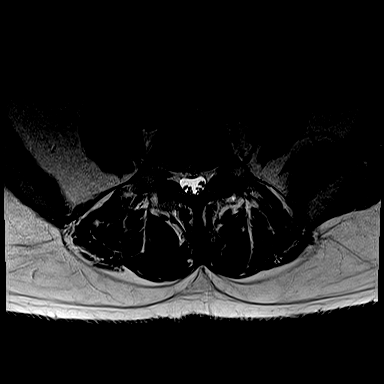
[im 11/33]
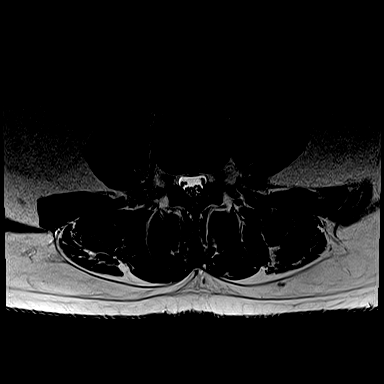
[im 17/33]
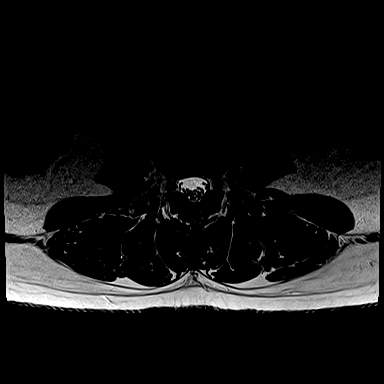
[im 22/33]
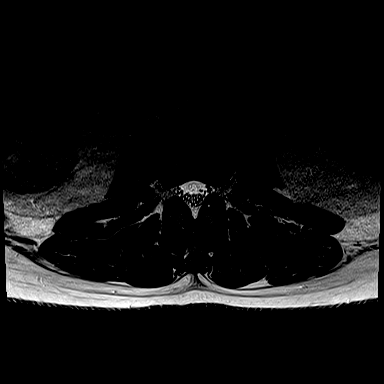
[im 27/33]
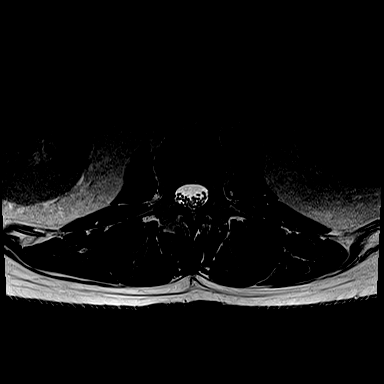
[im 33/33]
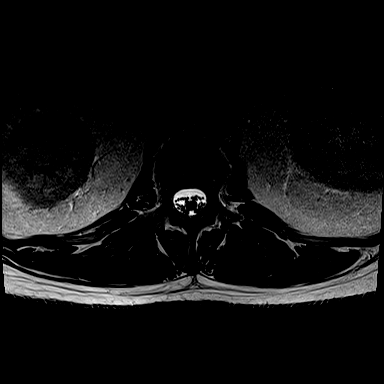

[Series 19: T1 · axial · 4.0mm · 0.57mm/px · z∈[-91,+94]mm · 7 of 33 slices shown (2 of 2)]
[im 1/33]
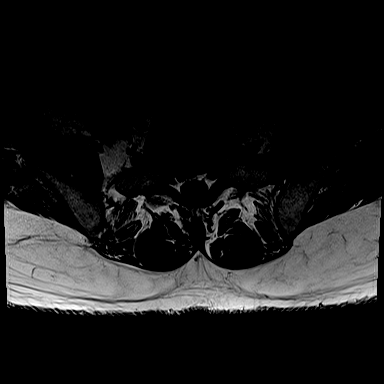
[im 6/33]
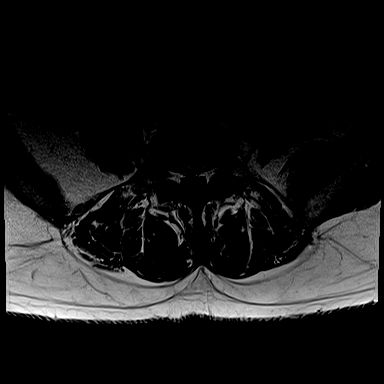
[im 11/33]
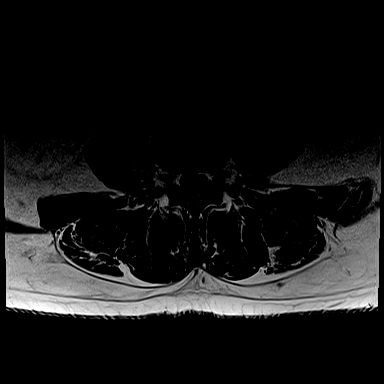
[im 17/33]
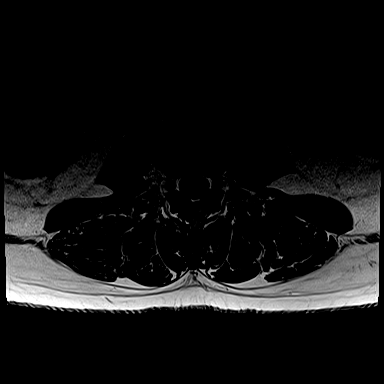
[im 22/33]
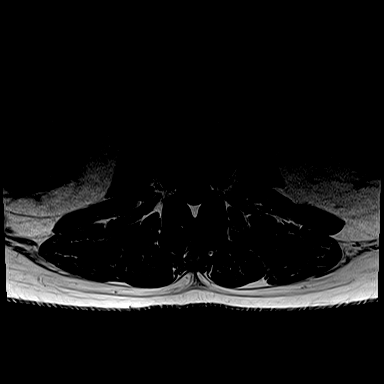
[im 27/33]
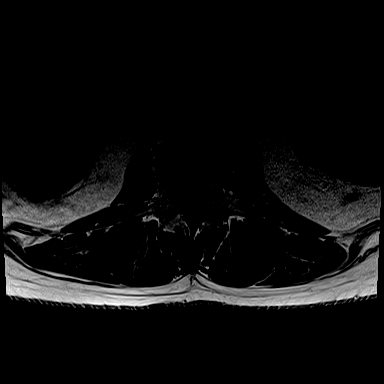
[im 33/33]
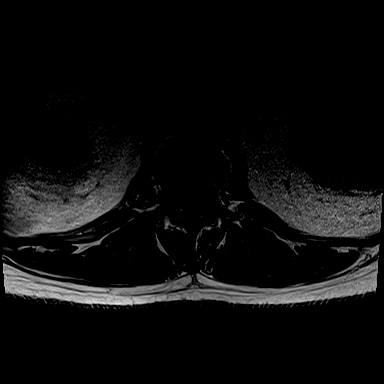

[25 of 48 positions shown; findings below may reference images not displayed]

FINDINGS: Sagittal images extend from T11 through S1. Axial images extend from L1 through S1. Five lumbar-type vertebral bodies are assumed counting from the sacrum at the lowest articulating disc.. T2 bright signal seen in the posterior L4-5 and L5-S1 discs. There is posterior epidural lipomatosis. Degenerative disc loss at L4-5 and L5-S1. Rudimentary disc at S1-S2. No retroperitoneal adenopathy. Visualized portions of the kidneys are within normal limits.
Spinal alignment is within normal limits.  No fractures are seen.  Vertebral bodies demonstrate normal height and marrow signal. The distal cord shows no abnormal signal.
The conus medullaris terminates at L1, and appears normal in contour and signal.
The abdominal aorta is normal in caliber.
Findings at individual disc levels are as follows:
T12-L1: No disc bulge. No neural foraminal or central canal stenosis. No facet arthropathy.
L1-L2: Mild facet arthropathy. No neural foraminal or central canal stenosis. No disc bulge.
L2-L3: No disc bulge. No neural foraminal or central canal stenosis. Mild facet arthropathy. Tiny bilateral facet effusions.
L3-L4: Tiny bilateral facet effusions. Mild facet arthropathy and ligamentum flavum thickening. No disc bulge. No neural foraminal or central canal stenosis.
L4-5: Diffuse circumferential disc bulge. No neural foraminal or central canal stenosis. Mild facet arthropathy and ligamentum flavum thickening. Tiny bilateral facet effusions.
L5-S1: Diffuse circumferential disc bulge. There is a tiny extruded fragment extending inferiorly from the L5-S1 disc space measuring 4 mm. There is marked severe left neural foraminal stenosis. Moderate right neural foraminal stenosis. Mild central canal stenosis. There is potential contact against the descending S1 nerve root on the right. There is severe right lateral recess stenosis. Moderate to severe left lateral recess stenosis.
IMPRESSION: Circumferential disc bulge with tiny right sided disc extrusion at L5-S1. This results in severe right lateral recess stenosis, moderate right neural foraminal stenosis, and potential contact against the descending right S1 nerve.
There is moderate to severe left neural foraminal stenosis at L5-S1 secondary to disc loss, disc bulge, facet arthropathy.
Tiny posterior annular tears involving the L4-5 and L5-S1 discs.

## 2021-05-24 IMAGING — MR MRI CERVICAL SPINE WITHOUT CONTRAST
8 series · 48 of 48 positions shown · non-contrast
Comparison: None

NECK PAIN SINCE 04-18-21 AND BACK PAIN ON AND OFF 10 YEARS
FINAL REPORT:
EXAM: MR Cervical Spine Without Contrast,
HISTORY: Cervicalgia
TECHNIQUE: Multisequence multiplanar images of the cervical spine were obtained.

[Series 1: survey · coronal · 1.7mm · 1.67mm/px · 20 of 96 slices shown]
[im 1/96]
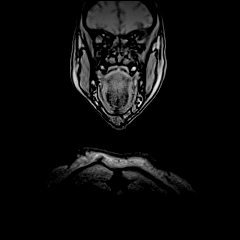
[im 6/96]
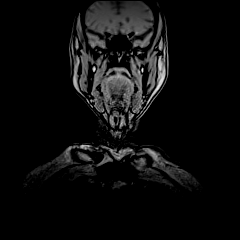
[im 11/96]
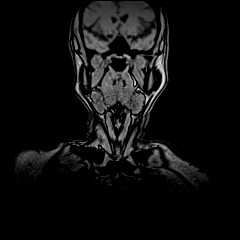
[im 16/96]
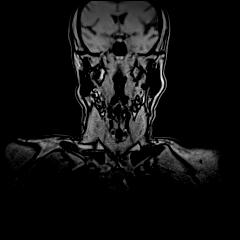
[im 21/96]
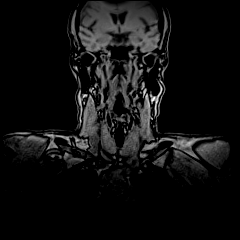
[im 26/96]
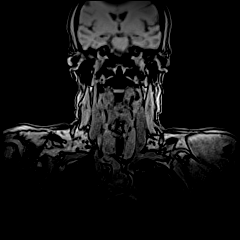
[im 31/96]
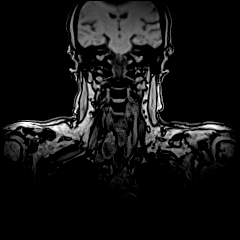
[im 36/96]
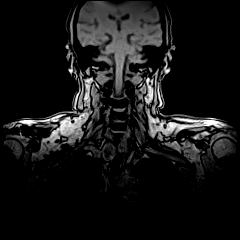
[im 41/96]
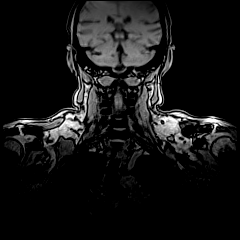
[im 46/96]
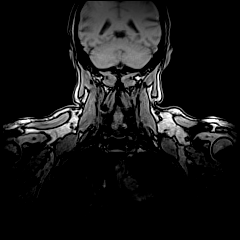
[im 51/96]
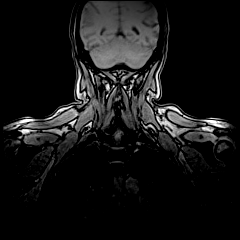
[im 56/96]
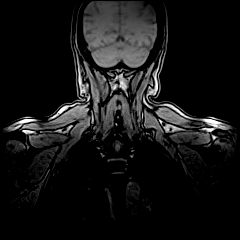
[im 61/96]
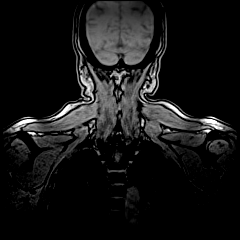
[im 66/96]
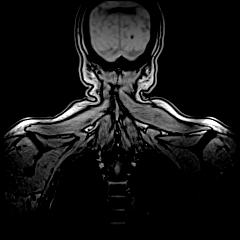
[im 71/96]
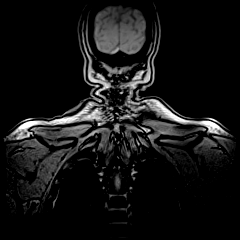
[im 76/96]
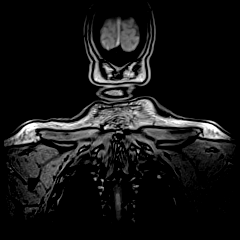
[im 81/96]
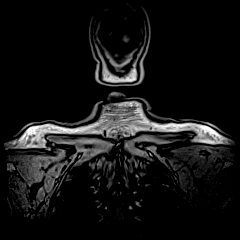
[im 86/96]
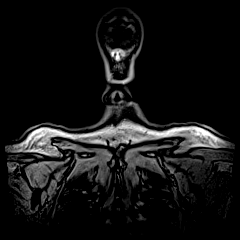
[im 91/96]
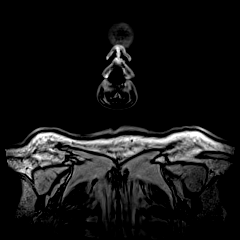
[im 96/96]
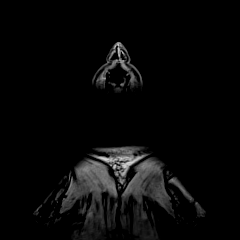

[Series 3: survey_mpr_sag · sagittal · 1.7mm · 1.67mm/px · 3 of 15 slices shown]
[im 1/15]
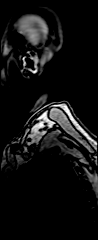
[im 8/15]
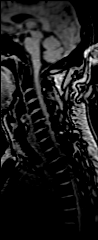
[im 15/15]
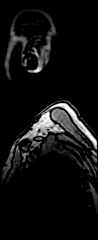

[Series 4: survey_mpr_(person_name) · axial · 1.7mm · 1.67mm/px · 1 of 7 slices shown]
[im 1/7]
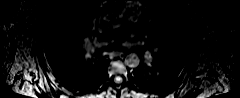

[Series 5: STIR · sagittal · 3.0mm · 0.86mm/px · 3 of 15 slices shown]
[im 1/15]
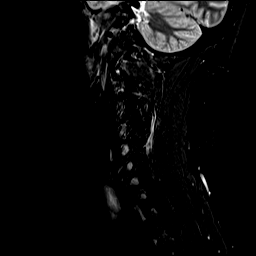
[im 8/15]
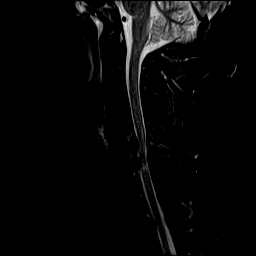
[im 15/15]
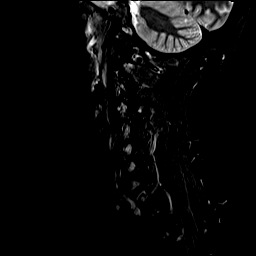

[Series 6: T2 · sagittal · 3.0mm · 0.57mm/px · 3 of 15 slices shown (1 of 2)]
[im 1/15]
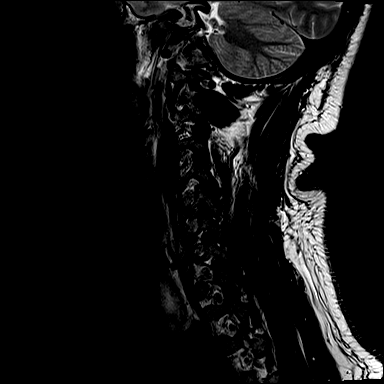
[im 8/15]
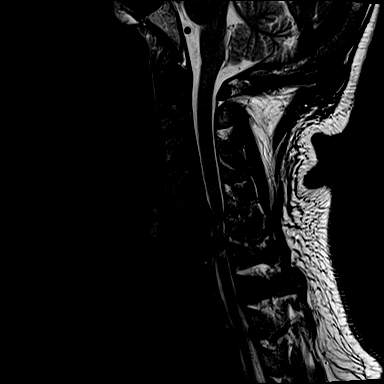
[im 15/15]
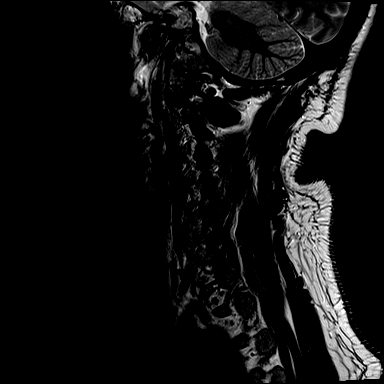

[Series 7: T1 · sagittal · 3.0mm · 0.69mm/px · 3 of 15 slices shown]
[im 1/15]
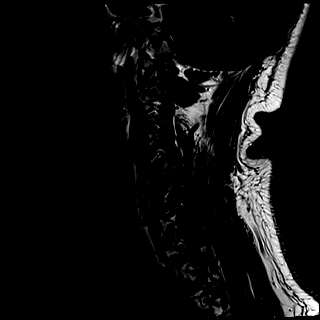
[im 8/15]
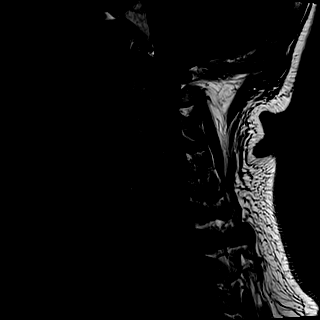
[im 15/15]
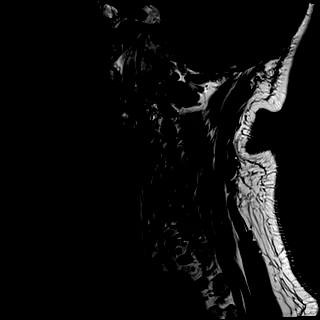

[Series 8: GRE · axial · 3.0mm · 0.35mm/px · z∈[-63,+33]mm · 6 of 32 slices shown]
[im 1/32]
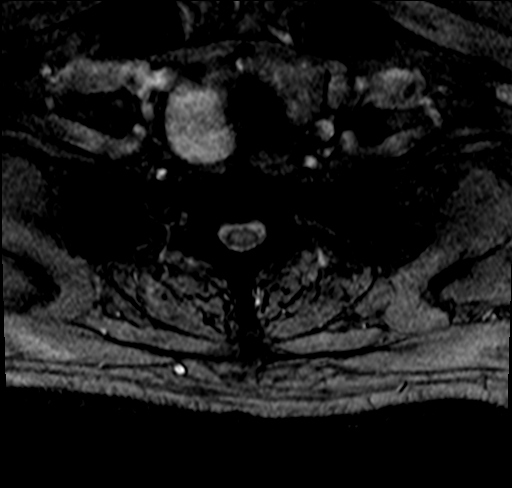
[im 7/32]
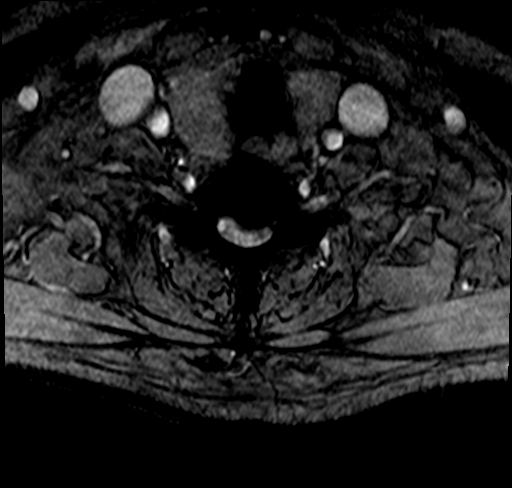
[im 13/32]
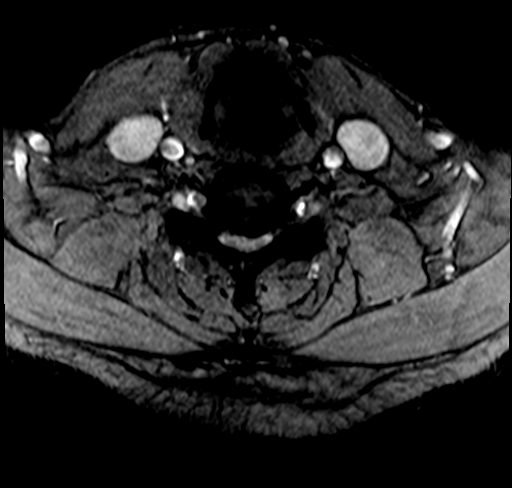
[im 19/32]
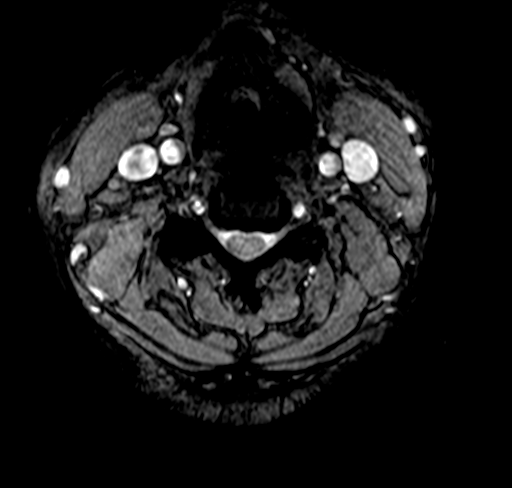
[im 25/32]
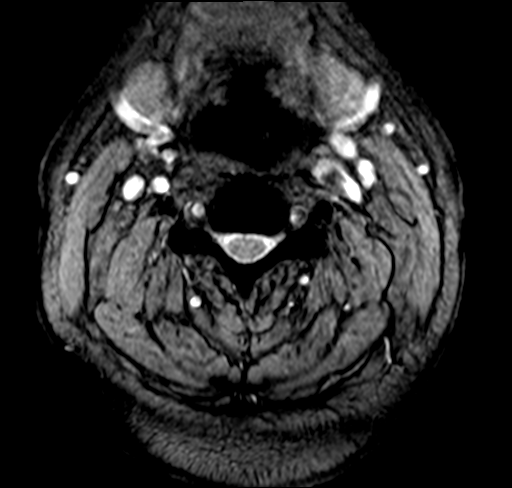
[im 32/32]
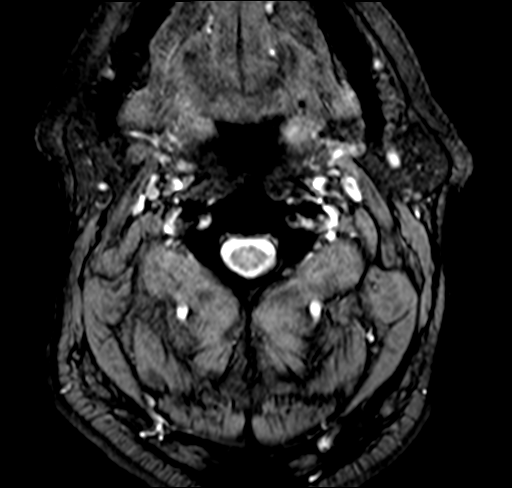

[Series 9: T2 · axial · 2.0mm · 0.70mm/px · z∈[-60,+28]mm · 9 of 48 slices shown (2 of 2)]
[im 1/48]
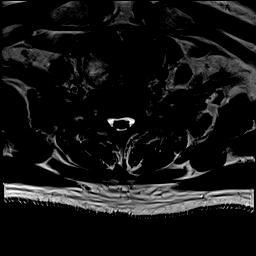
[im 6/48]
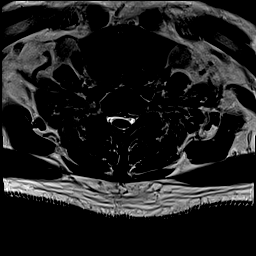
[im 12/48]
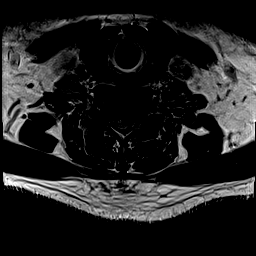
[im 18/48]
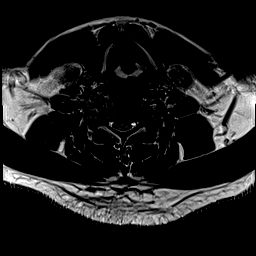
[im 24/48]
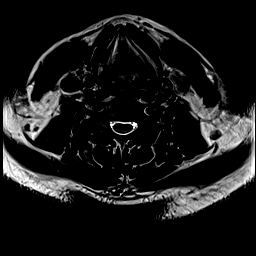
[im 30/48]
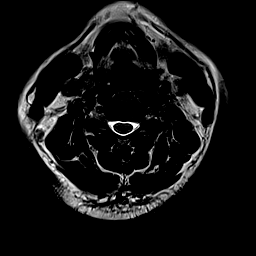
[im 36/48]
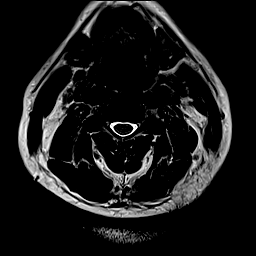
[im 42/48]
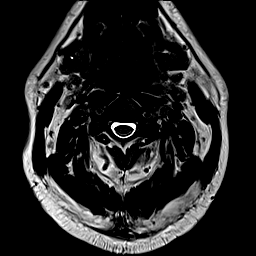
[im 48/48]
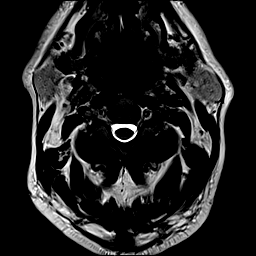

[48 of 48 positions shown; findings below may reference images not displayed]

FINDINGS: Sagittal images extend from C2 through T2. Axial images extend from C2 through T1
There is straightening involving the normal cervical lordosis. Vertebral bodies demonstrate normal height and marrow signal. There is T2 bright signal within the cervical cord at C5-C6 and C6-C7. No Chiari malformation. The dominant T2 bright lesion within the right thyroid measuring up to 2.5 cm. Visible posterior fossa structures are unremarkable, with no cerebellar tonsillar ectopia. Paraspinal soft tissues are unremarkable. 8 mm left apical nodule (series 1 image 76). Visualized portions of the parotid and submandibular glands are normal. No retropharyngeal soft tissue swelling. Flow-voids are present within the major vessels of the neck. No cervical adenopathy.
Findings at individual disc levels are as follows:
C2-3: No disc bulge. No neural foraminal or central canal stenosis. Mild facet arthropathy.
C3-C4: Mild bilateral neural foraminal stenosis. This is due to uncovertebral joint hypertrophy and facet arthropathy. No central canal stenosis. No disc bulge.
C4-C5: No neural foraminal stenosis or central canal stenosis. Mild facet arthropathy. No disc bulge.
C5-C6: Prominent disc osteophyte complex contacts and deforms the cervical cord. There is severe central canal stenosis. There is severe bilateral neural foraminal stenosis. This is due to uncovertebral joint hypertrophy and facet arthropathy.
C6-C7: Prominent disc osteophyte complex eccentric to the left paracentral space. There is severe central canal stenosis. There is severe bilateral neural foraminal stenosis. This is due to uncovertebral joint hypertrophy and facet arthropathy.
C7-T1: No neural foraminal stenosis or central canal stenosis. Mild facet arthropathy. Tiny disc bulge.
IMPRESSION: There is severe central canal stenosis and bilateral neural foraminal stenosis at C5-C6 and C6-C7. This is secondary to disc bulge, disc loss, uncovertebral joint hypertrophy and facet arthropathy. Additionally there is cord edema with early myelomalacia at C5-C6 and C6-C7 from disc compression.
Thyroid nodule greater than 1.5 cm noted. This warrants further follow up with a thyroid ultrasound. (#[id number])
8 mm left apical nodule (series 1 image 76), recommend dedicated CT chest without contrast for further evaluation.

## 2021-07-05 IMAGING — US US THYROID
1 series · 14 of 25 positions shown · non-contrast
Comparison: None available.

FINAL REPORT:
EXAM: US THYROID
INDICATION: Nontoxic single thyroid nodule
TECHNIQUE: Sonographic gray-scale and color Doppler images of the thyroid gland were obtained.

[Series 1: us thyroid · 14 of 61 slices shown]
[im 1/61]
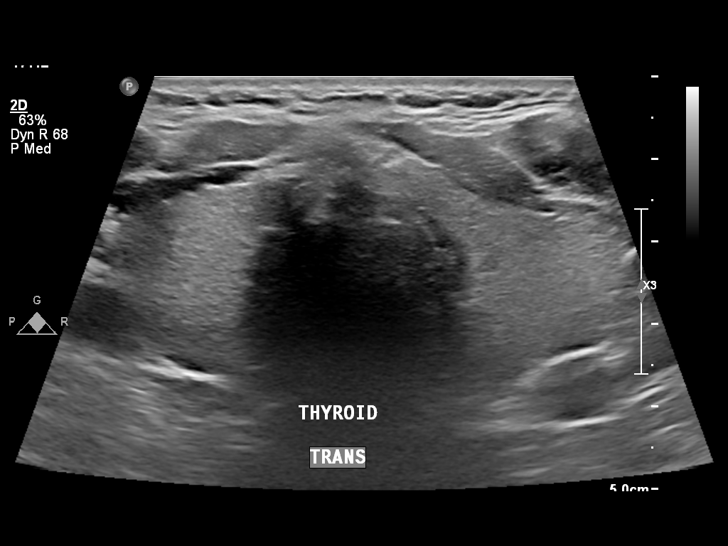
[im 6/61]
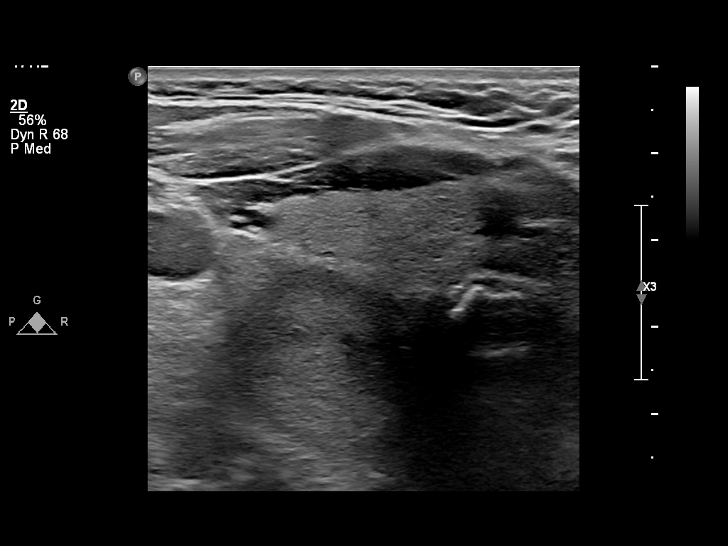
[im 11/61]
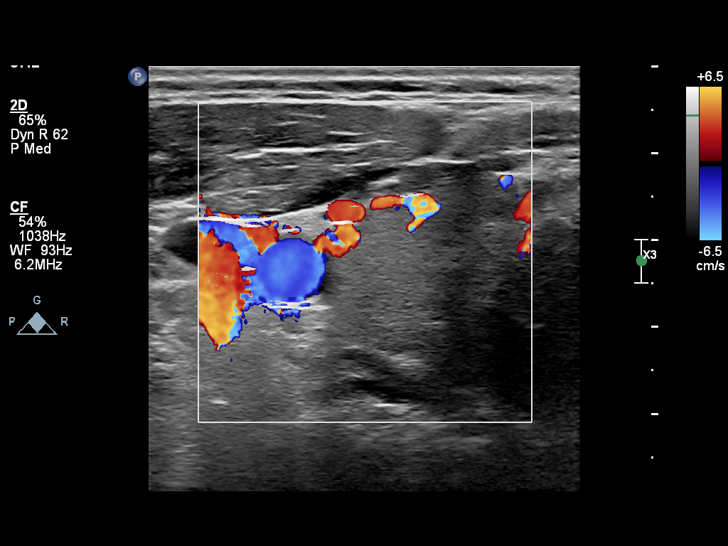
[im 16/61]
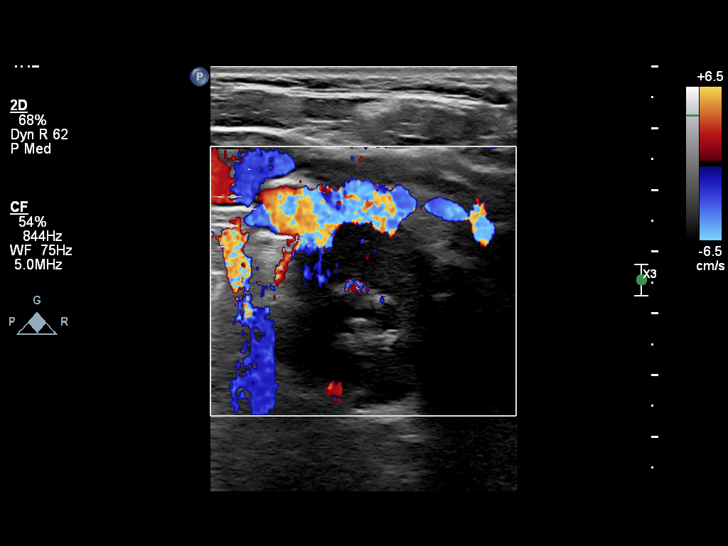
[im 21/61]
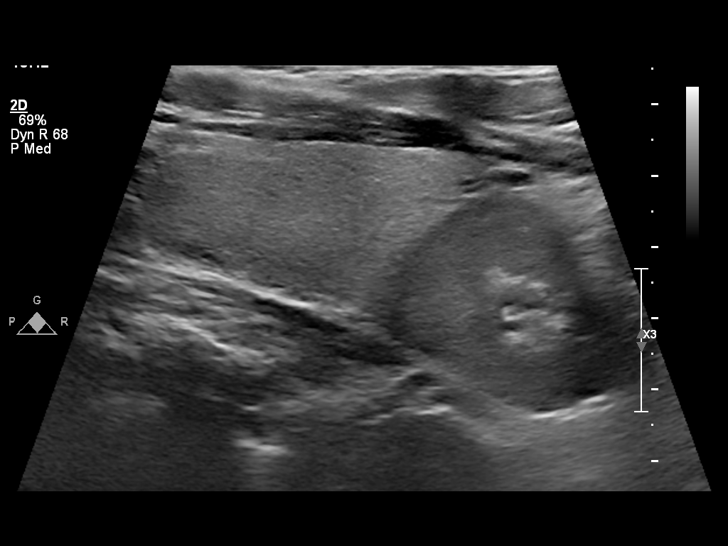
[im 23/61]
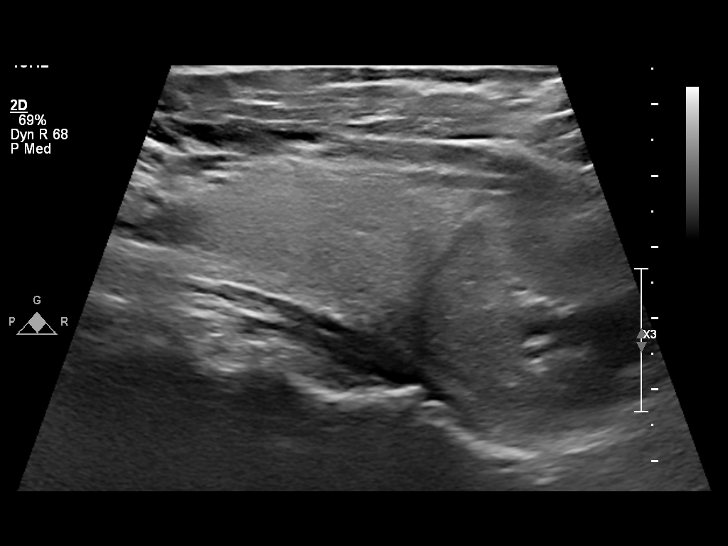
[im 28/61]
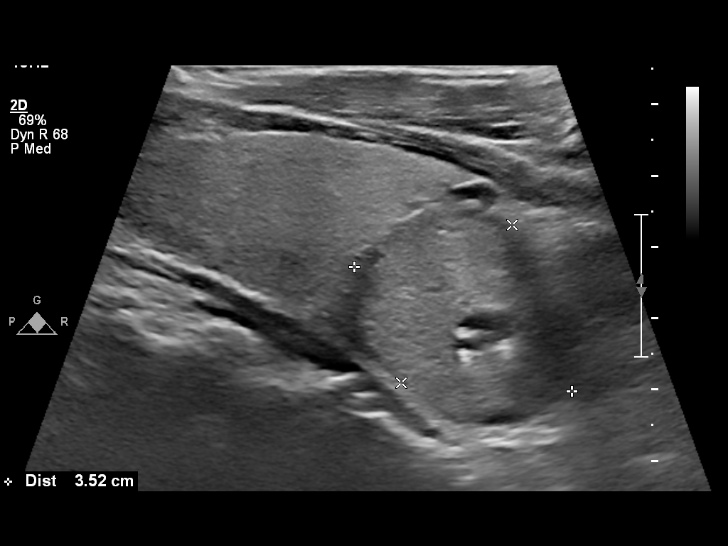
[im 33/61]
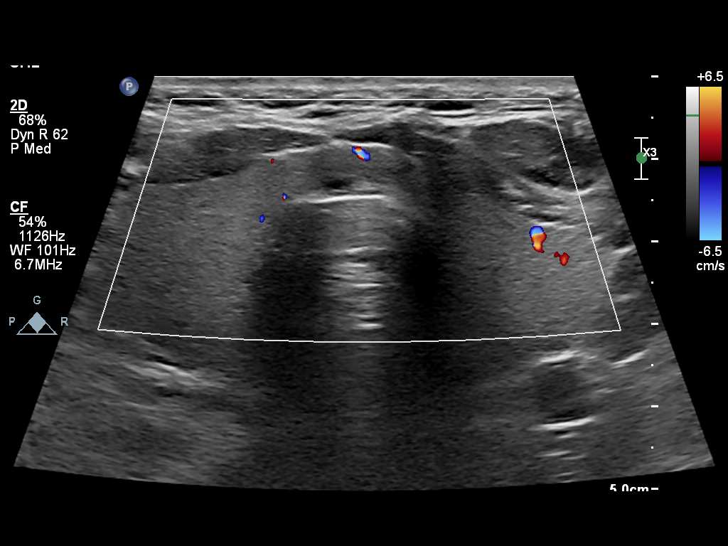
[im 38/61]
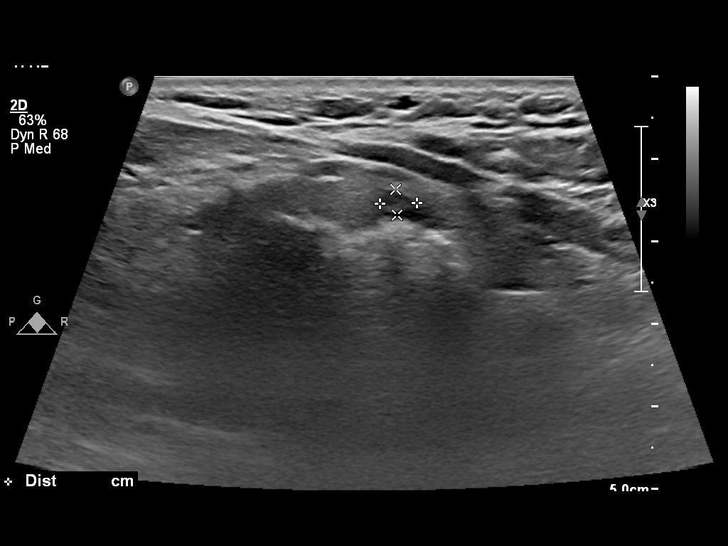
[im 41/61]
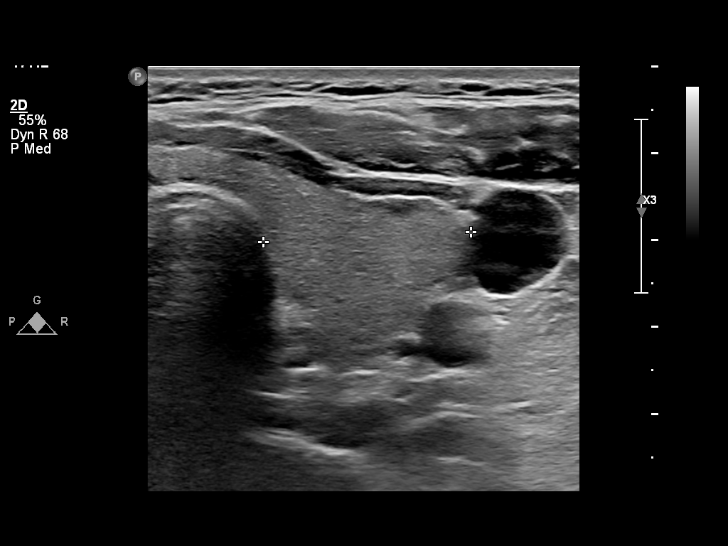
[im 46/61]
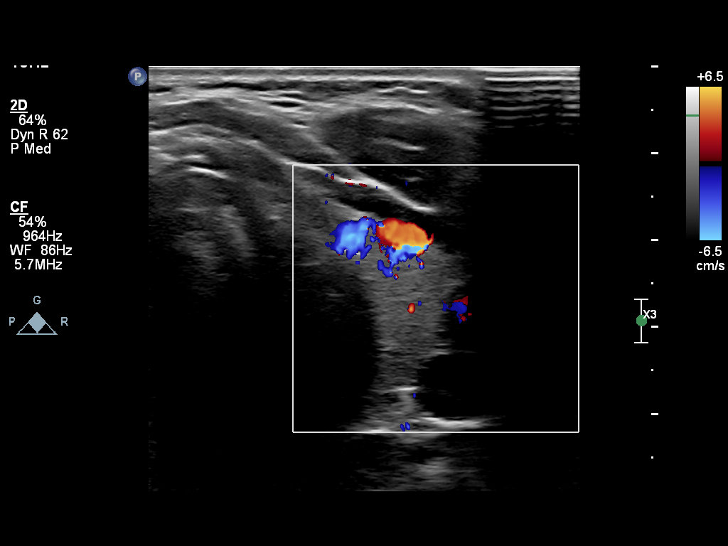
[im 51/61]
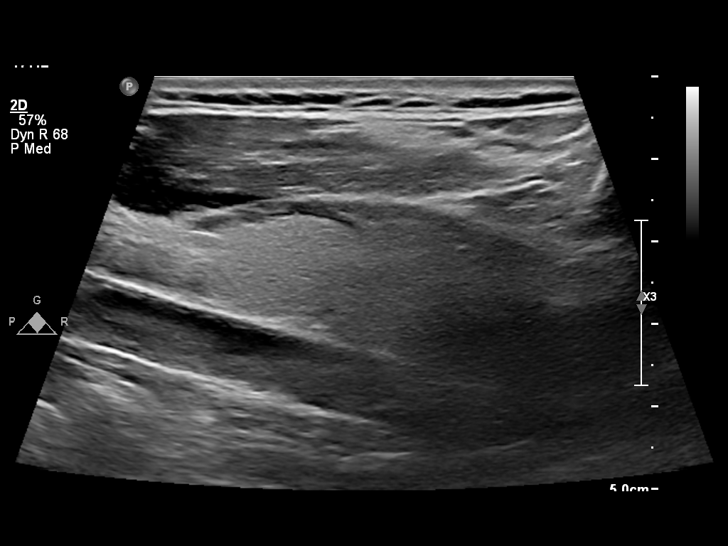
[im 56/61]
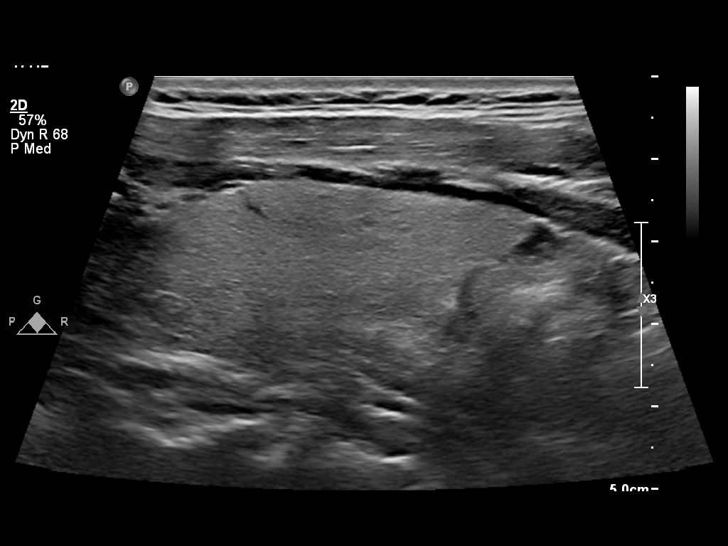
[im 61/61]
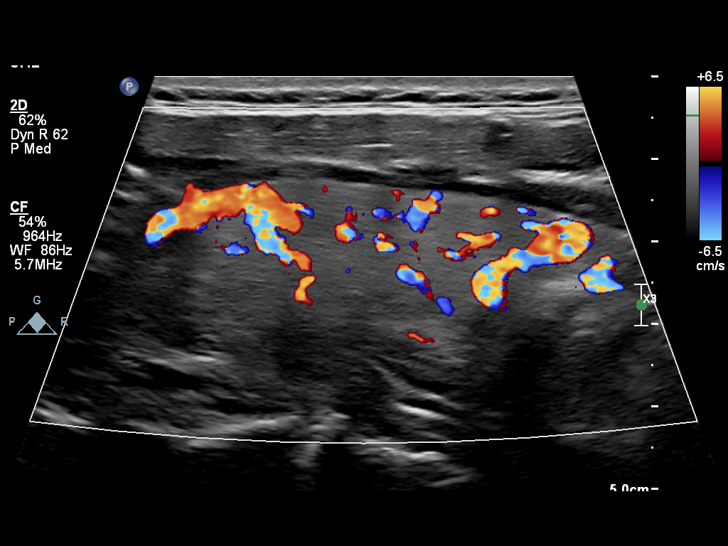

[14 of 25 positions shown; findings below may reference images not displayed]

FINDINGS: THYROID GLAND:
Parenchyma: Normal homogeneous echotexture.
Right lobe size: 7.5 x 3.0 x 2.5 cm.
Left lobe size: 5.8 x 2.3 x 2.4 cm.
Isthmus size: 0.4 cm.
Representative nodule:
Right lobe nodule measuring 3.5 x 2.7 x 2.2 cm, predominantly solid and isoechoic, wider than tall, with internal microcalcifications.
LYMPH NODES: No lymphadenopathy.
IMPRESSION: Right thyroid lobe nodule measuring up to 3.5 cm.
Based on TI-RADS Criteria, recommended management is: TR4 - Moderately suspicious (>1.5 cm): Thyroid nodule FNA

## 2021-07-05 IMAGING — CT CT CHEST WITH CONTRAST
3 of 5 series · 16 of 30 positions shown, 19 images · IV contrast (ISOVUE 370)
Comparison: None
Axial spiral CT acquisition was performed from the base of the neck through the upper abdomen following IV contrast.

Follow up pulmonary nodule, Extensive family history of cancer. Patient history of diabetes, thyroid mass.
FINAL REPORT:
CT chest:
CLINICAL INDICATION: R

[Series 2: routine chest · axial · 0.83mm/px · z∈[-301,-121]mm · 4 of 146 slices shown]
[im 37/146  lung]
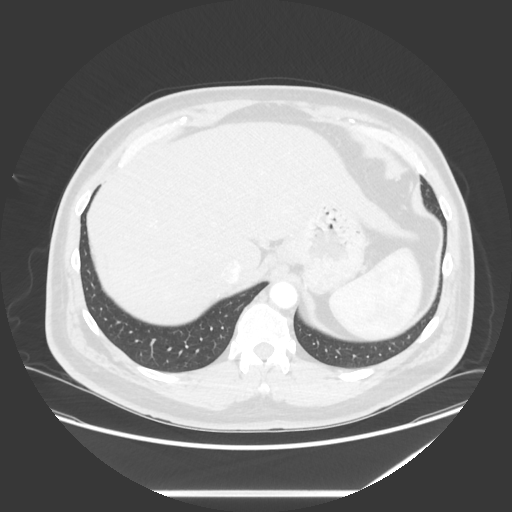
[im 73/146  lung]
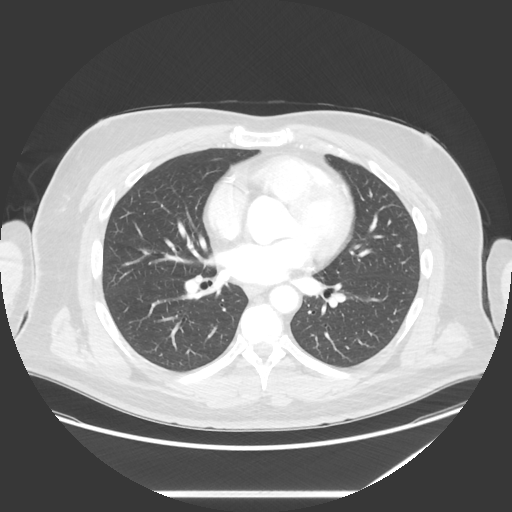
[im 77/146  lung]
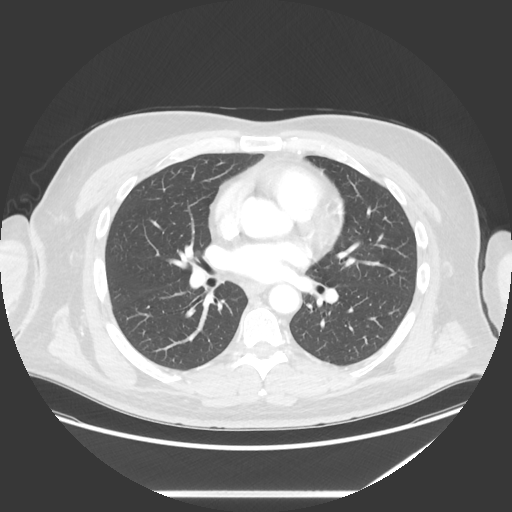
[im 109/146  lung]
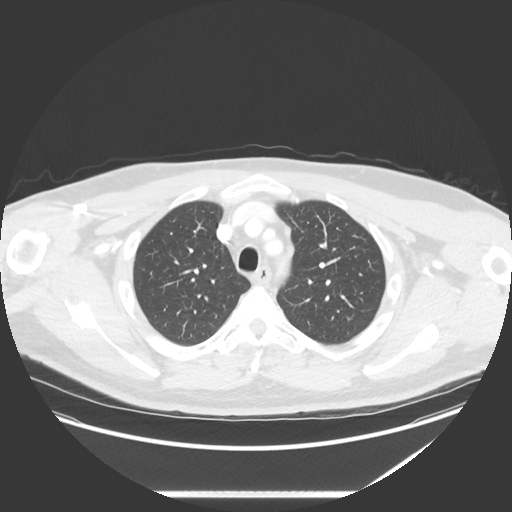

[Series 4: chest thins · axial · 0.83mm/px · z∈[-352,-69]mm · 9 of 292 slices shown, 12 images]
[im 33/292  mediastinal]
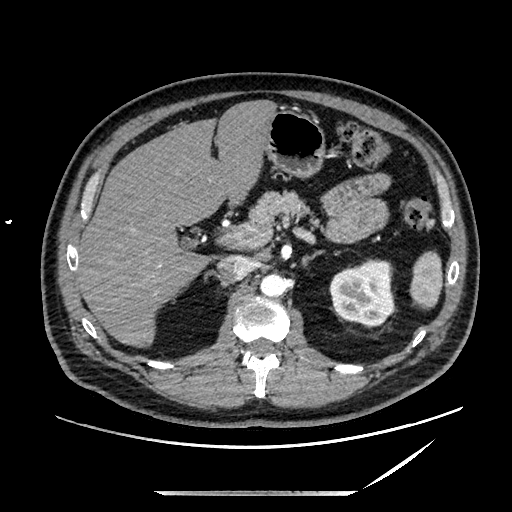
[im 33/292  lung]
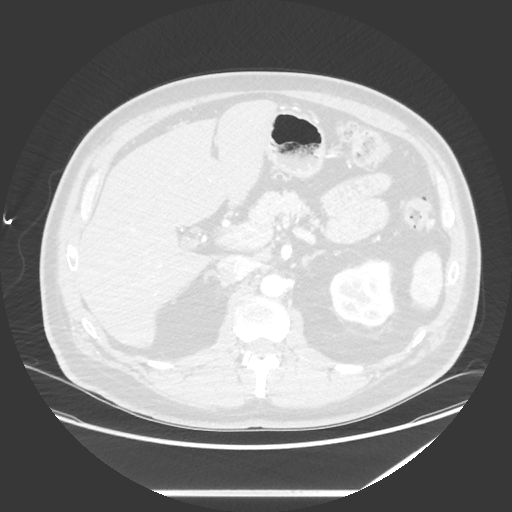
[im 65/292  lung]
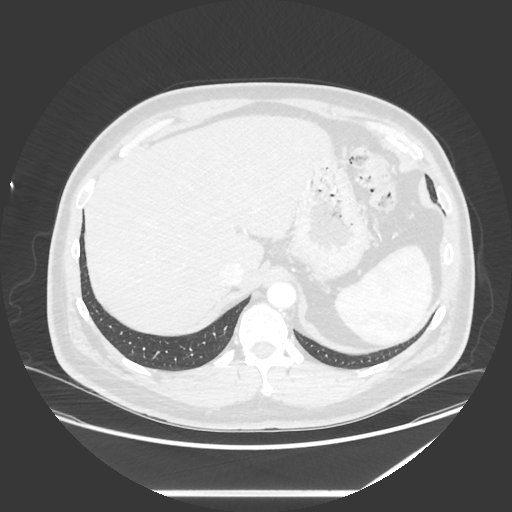
[im 98/292  lung]
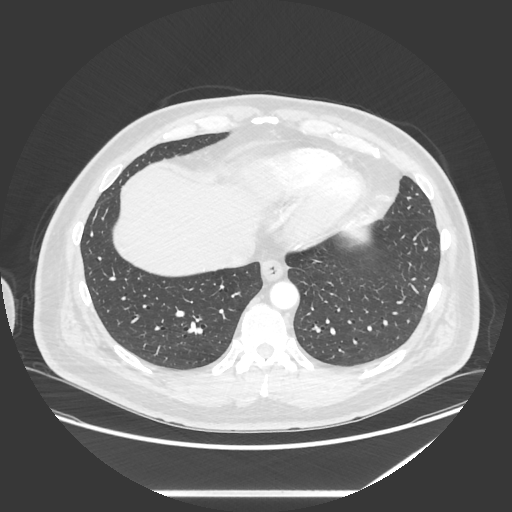
[im 130/292  lung]
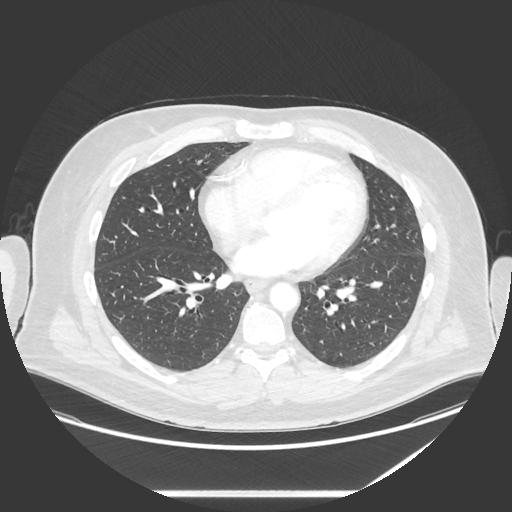
[im 153/292  mediastinal]
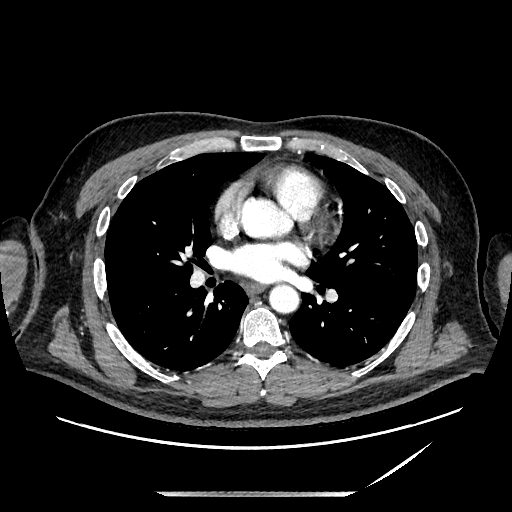
[im 153/292  lung]
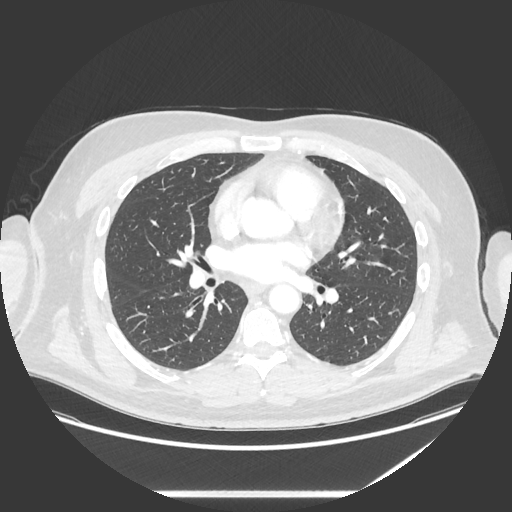
[im 162/292  lung]
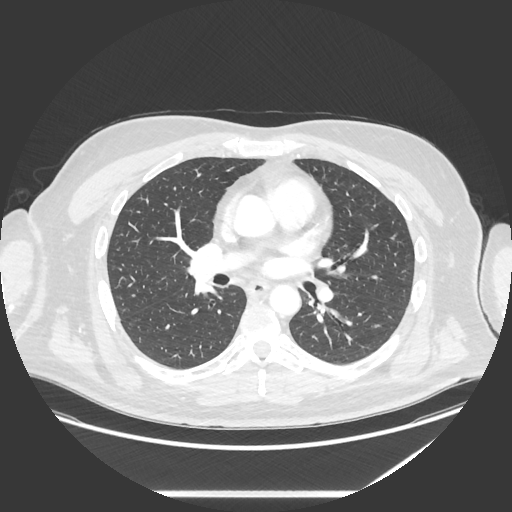
[im 195/292  lung]
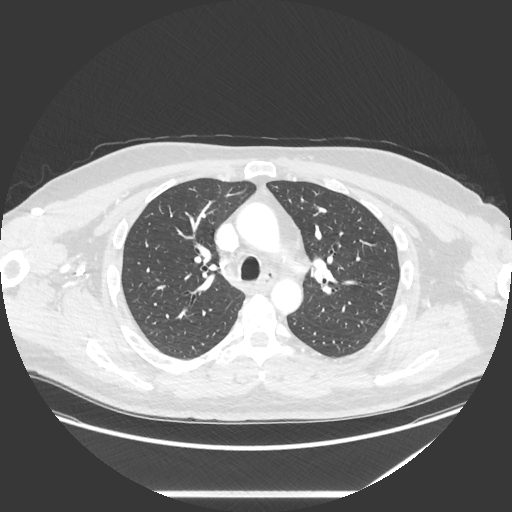
[im 227/292  lung]
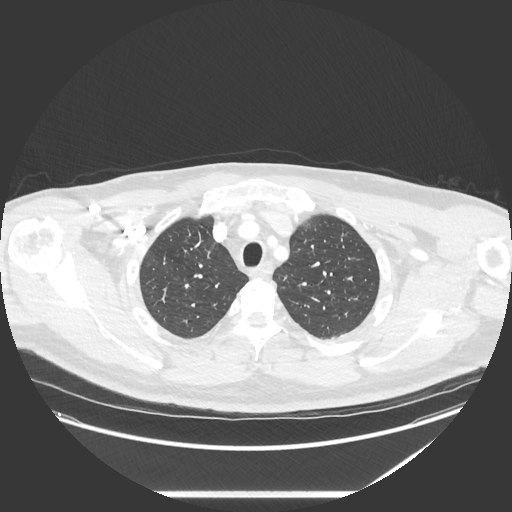
[im 259/292  mediastinal]
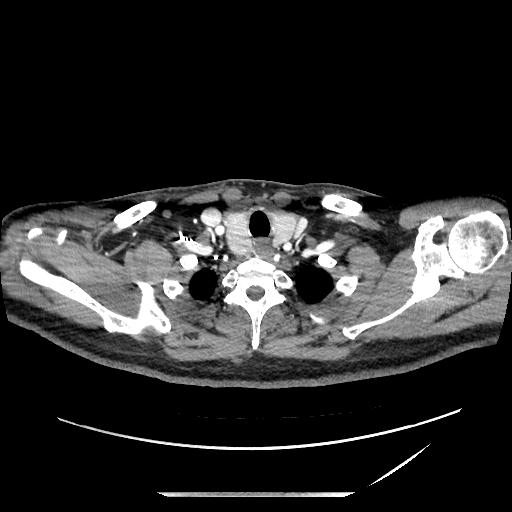
[im 259/292  lung]
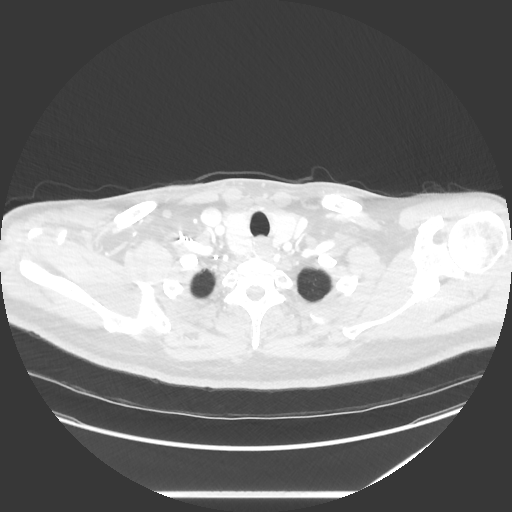

[Series 602: sagittal · sagittal · 0.82mm/px · 3 of 212 slices shown]
[im 36/212  lung]
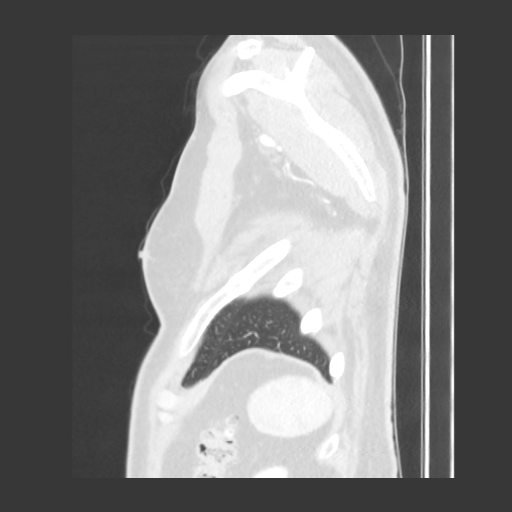
[im 71/212  lung]
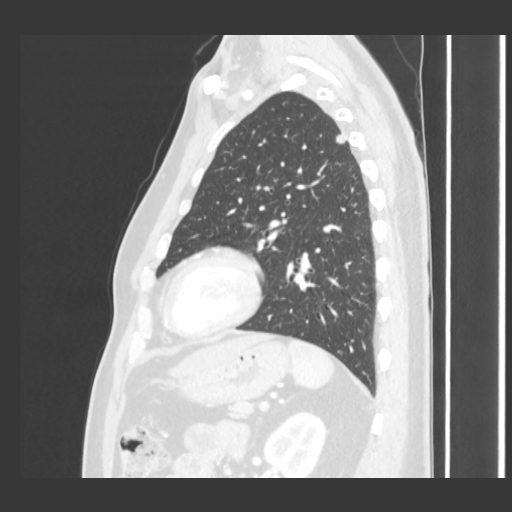
[im 106/212  lung]
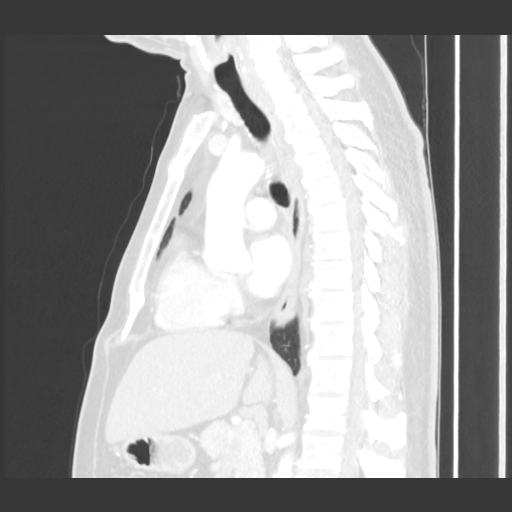

[16 of 30 positions shown; findings below may reference images not displayed]

All CT scans at this facility use iterative reconstruction technique, dose modulation and/or weight based dosing when appropriate to reduce radiation dose to as low as reasonably achievable.
No axillary lymphadenopathy or chest wall masses appreciated. There is a heterogeneously enhancing nodule in the lower pole of the right thyroid lobe. This measures 2.6 cm. There is thoracic spine degenerative disease. The heart appears of normal size. No vascular filling defect is seen. There are coronary artery calcifications. No mediastinal adenopathy is appreciated. The central airways and esophagus appear within normal limits.
The lungs appear well expanded. There is no acute infiltrate. There is a 9 x 6 mm pleural-based nodule left upper lobe image 35 series 3. If there are no prior exams for comparison, [DATE] month follow-up is recommended. No pneumothorax or effusion is seen.
IMPRESSION: Right thyroid nodule.
9 x 6 mm pleural-based left upper lobe nodule. 3-6 month follow-up is recommended.
